# Patient Record
Sex: Male | Born: 1970 | Race: Black or African American | Hispanic: No | Marital: Single | Smoking: Never smoker
Health system: Southern US, Community
[De-identification: ages and names within clinical notes are randomized; demographics above are authoritative.]

---

## 2003-07-07 ENCOUNTER — Emergency Department (HOSPITAL_COMMUNITY): Admission: EM | Admit: 2003-07-07 | Discharge: 2003-07-07 | Payer: Self-pay | Admitting: Emergency Medicine

## 2004-01-16 ENCOUNTER — Emergency Department: Payer: Self-pay | Admitting: Emergency Medicine

## 2005-04-11 ENCOUNTER — Emergency Department: Payer: Self-pay | Admitting: Emergency Medicine

## 2006-10-06 ENCOUNTER — Emergency Department: Payer: Self-pay | Admitting: Emergency Medicine

## 2016-10-05 ENCOUNTER — Observation Stay (HOSPITAL_BASED_OUTPATIENT_CLINIC_OR_DEPARTMENT_OTHER)
Admit: 2016-10-05 | Discharge: 2016-10-05 | Disposition: A | Discharge status: Home / Self Care | Payer: Self-pay | Attending: Internal Medicine | Admitting: Internal Medicine

## 2016-10-05 ENCOUNTER — Observation Stay
Admission: EM | Admit: 2016-10-05 | Discharge: 2016-10-05 | Disposition: A | Discharge status: Home / Self Care | Payer: Self-pay | Attending: Internal Medicine | Admitting: Internal Medicine

## 2016-10-05 ENCOUNTER — Emergency Department: Payer: Self-pay

## 2016-10-05 ENCOUNTER — Encounter: Payer: Self-pay | Admitting: Emergency Medicine

## 2016-10-05 DIAGNOSIS — R0789 Other chest pain: Principal | ICD-10-CM | POA: Insufficient documentation

## 2016-10-05 DIAGNOSIS — R079 Chest pain, unspecified: Secondary | ICD-10-CM

## 2016-10-05 DIAGNOSIS — Z6832 Body mass index (BMI) 32.0-32.9, adult: Secondary | ICD-10-CM | POA: Insufficient documentation

## 2016-10-05 DIAGNOSIS — E669 Obesity, unspecified: Secondary | ICD-10-CM | POA: Insufficient documentation

## 2016-10-05 DIAGNOSIS — R002 Palpitations: Secondary | ICD-10-CM

## 2016-10-05 LAB — CBC WITH DIFFERENTIAL/PLATELET
BASOS ABS: 0 10*3/uL (ref 0–0.1)
Basophils Relative: 1 %
Eosinophils Absolute: 0.1 10*3/uL (ref 0–0.7)
Eosinophils Relative: 1 %
HEMATOCRIT: 41.6 % (ref 40.0–52.0)
Hemoglobin: 14.2 g/dL (ref 13.0–18.0)
LYMPHS PCT: 41 %
Lymphs Abs: 2.3 10*3/uL (ref 1.0–3.6)
MCH: 28.7 pg (ref 26.0–34.0)
MCHC: 34.2 g/dL (ref 32.0–36.0)
MCV: 84 fL (ref 80.0–100.0)
Monocytes Absolute: 0.6 10*3/uL (ref 0.2–1.0)
Monocytes Relative: 10 %
NEUTROS ABS: 2.7 10*3/uL (ref 1.4–6.5)
Neutrophils Relative %: 47 %
PLATELETS: 173 10*3/uL (ref 150–440)
RBC: 4.95 MIL/uL (ref 4.40–5.90)
RDW: 13.3 % (ref 11.5–14.5)
WBC: 5.8 10*3/uL (ref 3.8–10.6)

## 2016-10-05 LAB — COMPREHENSIVE METABOLIC PANEL
ALT: 18 U/L (ref 17–63)
AST: 19 U/L (ref 15–41)
Albumin: 3.5 g/dL (ref 3.5–5.0)
Alkaline Phosphatase: 42 U/L (ref 38–126)
Anion gap: 6 (ref 5–15)
BILIRUBIN TOTAL: 1.3 mg/dL — AB (ref 0.3–1.2)
BUN: 14 mg/dL (ref 6–20)
CHLORIDE: 104 mmol/L (ref 101–111)
CO2: 27 mmol/L (ref 22–32)
CREATININE: 1.2 mg/dL (ref 0.61–1.24)
Calcium: 8.6 mg/dL — ABNORMAL LOW (ref 8.9–10.3)
GFR calc Af Amer: >60 mL/min (ref >60)
GLUCOSE: 106 mg/dL — AB (ref 65–99)
Potassium: 4.2 mmol/L (ref 3.5–5.1)
Sodium: 137 mmol/L (ref 135–145)
Total Protein: 6.3 g/dL — ABNORMAL LOW (ref 6.5–8.1)

## 2016-10-05 LAB — TSH: TSH: 4.198 u[IU]/mL (ref 0.350–4.500)

## 2016-10-05 LAB — TROPONIN I
Troponin I: <0.03 ng/mL
Troponin I: <0.03 ng/mL (ref <0.03)

## 2016-10-05 LAB — ECHOCARDIOGRAM COMPLETE
Height: 72 in
Weight: 3859.2 [oz_av]

## 2016-10-05 MED ORDER — SODIUM CHLORIDE 0.9 % IV BOLUS (SEPSIS)
1000.0000 mL | Freq: Once | INTRAVENOUS | Status: AC
  Administered 2016-10-05: 1000 mL via INTRAVENOUS

## 2016-10-05 MED ORDER — ONDANSETRON HCL 4 MG/2ML IJ SOLN: 4.0000 mg | Freq: Four times a day (QID) | INTRAMUSCULAR | Status: DC | PRN

## 2016-10-05 MED ORDER — ACETAMINOPHEN 325 MG PO TABS: 650.0000 mg | ORAL_TABLET | Freq: Four times a day (QID) | ORAL | Status: DC | PRN

## 2016-10-05 MED ORDER — ENOXAPARIN SODIUM 40 MG/0.4ML ~~LOC~~ SOLN: 40.0000 mg | SUBCUTANEOUS | Status: DC

## 2016-10-05 MED ORDER — MORPHINE SULFATE (PF) 4 MG/ML IV SOLN
4.0000 mg | Freq: Once | INTRAVENOUS | Status: AC
  Administered 2016-10-05: 4 mg via INTRAVENOUS
  Filled 2016-10-05: qty 1

## 2016-10-05 MED ORDER — DOCUSATE SODIUM 100 MG PO CAPS: 100.0000 mg | ORAL_CAPSULE | Freq: Two times a day (BID) | ORAL | Status: DC

## 2016-10-05 MED ORDER — IOPAMIDOL (ISOVUE-370) INJECTION 76%
75.0000 mL | Freq: Once | INTRAVENOUS | Status: AC | PRN
  Administered 2016-10-05: 75 mL via INTRAVENOUS

## 2016-10-05 MED ORDER — ACETAMINOPHEN 650 MG RE SUPP: 650.0000 mg | Freq: Four times a day (QID) | RECTAL | Status: DC | PRN

## 2016-10-05 MED ORDER — ONDANSETRON HCL 4 MG/2ML IJ SOLN
4.0000 mg | Freq: Once | INTRAMUSCULAR | Status: AC
  Administered 2016-10-05: 4 mg via INTRAVENOUS
  Filled 2016-10-05: qty 2

## 2016-10-05 MED ORDER — ONDANSETRON HCL 4 MG PO TABS: 4.0000 mg | ORAL_TABLET | Freq: Four times a day (QID) | ORAL | Status: DC | PRN

## 2016-10-05 NOTE — Progress Notes (Signed)
Jesse Adams was seen and treated at [facility] yesterday [phone](Sat., July 14th) and today Jesse Adams(Sun., July 15th), and was discharged from said facility at about 6:15P on Sunday evening. He is authorized to return to work tomorrow - Monday, July 16th with no restrictions per Dr. 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 Jesse Adams

## 2016-10-05 NOTE — ED Triage Notes (Signed)
Pt is ambulatory to triage with c/o chest pain that started in center chest and radiated to the right arm. Pt denies any other cardiac symptoms. Pt is in NAD at this time.

## 2016-10-05 NOTE — ED Notes (Signed)
Pt transport to 235

## 2016-10-05 NOTE — Consult Note (Addendum)
Cardiology Consult    Patient ID: Jesse Adams MRN: [id number], DOB/AGE: 11/03/1970   Admit date: 10/05/2016 Date of Consult: 10/05/2016  Primary Physician: Patient, No Pcp Per Primary Cardiologist:  New to [facility] Requesting Provider: Shaune Pollack, MD   Patient Profile    Jesse Adams is a 46 y.o. male with a No past medical history noted, who is being seen today for the evaluation of right-sided chest pain and abnormal CT scan at the request of Dr. Imogene Burn.  Past Medical History   History reviewed. No pertinent past medical history. Marland Kitchen -- Notably, no history of diabetes, hypertension, hyperlipidemia. No smoking history History reviewed. No pertinent surgical history.   Allergies  No Known Allergies  History of Present Illness    Patient is a very pleasant gentleman who has no major medical history brought to the emergency room by EMS after he had sudden onset right-sided retrosternal chest pain that radiated to the back. He noticed it when he's lying down to go to sleep. Not associated with any type of exertion. He did not notice it is worse sitting up versus lying down. When the pain came on, it took his breath away, but he otherwise did not notice dyspnea. The pain sort of "lingered "and when he could not get a hold of his wife, he was concerned and called EMS. It continued when he went down to his couch to sit. His breathing did improve. Pain was reduced down from about 9/10-10 at worst one home to about 8/10 in the EMS truck and was 6/10 upon arrival to the emergency room. Upon evaluation in emergency room, EKG was relatively normal, and he has now subsequently ruled out with 3 negative troponin levels. Cardiology was consulted partly because of chest pain, but also because CT scan performed the ER suggested some type of fluid collection anterior to the heart. An echocardiogram was therefore ordered. I reviewed the echocardiogram, but it has not had a formal  read.  At baseline, he is very active, plays basketball and other exercise without any difficulty. Prior to this episode: Cardiovascular ROS: no chest pain or dyspnea on exertion negative for - edema, irregular heartbeat, loss of consciousness, murmur, orthopnea, palpitations, paroxysmal nocturnal dyspnea, rapid heart rate or shortness of breath   Inpatient Medications    . docusate sodium  100 mg Oral BID  . enoxaparin (LOVENOX) injection  40 mg Subcutaneous Q24H    Family History    Family History  Problem Relation Age of Onset  . CAD Father     Social History    Social History   Social History  . Marital status: Single    Spouse name: N/A  . Number of children: N/A  . Years of education: N/A   Occupational History  . Not on file.   Social History Main Topics  . Smoking status: Never Smoker  . Smokeless tobacco: Never Used  . Alcohol use No  . Drug use: No  . Sexual activity: Not on file   Other Topics Concern  . Not on file   Social History Narrative  . No narrative on file     Review of Systems    General:  No chills, fever, night sweats or weight changes.  Cardiovascular:  Prior to presentation: No chest pain. Otherwise No dyspnea on exertion, edema, orthopnea, palpitations, paroxysmal nocturnal dyspnea. Dermatological: No rash, lesions/masses Respiratory: No cough, dyspnea Urologic: No hematuria, dysuria Abdominal:   No nausea, vomiting, diarrhea, bright  red blood per rectum, melena, or hematemesis Neurologic:  No visual changes, wkns, changes in mental status. All other systems reviewed and are otherwise negative except as noted above.  Physical Exam    Blood pressure 108/64, pulse 68, temperature 98.1 F (36.7 C), temperature source Oral, resp. rate 18, height 6' (1.829 m), weight 241 lb 3.2 oz (109.4 kg), SpO2 95 %.  General: Pleasant, NAD.  Well-nourished, well-groomed. Healthy-appearing. Resting comfortably. Psych: .Pleasant mood and  affect. Neuro: Alert and oriented X 3. Moves all extremities spontaneously. CN II-XII grossly intact HEENT: Normal  Neck: Supple without bruits or JVD. Lungs:  Resp regular and unlabored, CTA. Heart: RRR no s3, s4, or murmurs. Normal S1 & S2.  Non-displaced PMI Abdomen: Soft, non-tender, non-distended, BS + x 4.  Extremities: No clubbing, cyanosis or edema. DP/PT/Radials 2+ and equal bilaterally.  Labs    Troponin (Point of Care Test) No results for input(s): TROPIPOC in the last 72 hours.  Recent Labs  10/05/16 0144 10/05/16 0711 10/05/16 1259  TROPONINI <0.03 <0.03 <0.03   Lab Results  Component Value Date   WBC 5.8 10/05/2016   HGB 14.2 10/05/2016   HCT 41.6 10/05/2016   MCV 84.0 10/05/2016   PLT 173 10/05/2016    Recent Labs Lab 10/05/16 0144  NA 137  K 4.2  CL 104  CO2 27  BUN 14  CREATININE 1.20  CALCIUM 8.6*  PROT 6.3*  BILITOT 1.3*  ALKPHOS 42  ALT 18  AST 19  GLUCOSE 106*   No results found for: CHOL, HDL, LDLCALC, TRIG No results found for: [facility]   Radiology Studies    I personally reviewed CT scan in order to search for the ovoid fluid attenuation. This appears to be relatively high/cranial from a echocardiogram standpoint. Not likely to be LV seen by echo. Ct Angio Chest Pe W/cm &/or Wo Cm  Result Date: 10/05/2016 CLINICAL DATA:  46 year old male with right-sided chest pain. EXAM: CT ANGIOGRAPHY CHEST WITH CONTRAST TECHNIQUE: Multidetector CT imaging of the chest was performed using the standard protocol during bolus administration of intravenous contrast. Multiplanar CT image reconstructions and MIPs were obtained to evaluate the vascular anatomy. CONTRAST:  75 cc Isovue 370 COMPARISON:  Chest radiograph dated 10/05/2016 FINDINGS: Cardiovascular: Top-normal cardiac size. No pericardial effusion. The thoracic aorta is unremarkable. The origins of the great vessels of the aortic arch appear patent. There is no CT evidence of pulmonary embolism.  Mediastinum/Nodes: There is a top-normal right paratracheal lymph node. A 12 x 33 mm ovoid structure with fluid attenuation in the anterior mediastinum anterior to the aortic arch may represent small amount of fluid within the pericardial recesses or a mildly enlarged lymph node. There is no hilar adenopathy. The esophagus and the thyroid gland are grossly unremarkable. Lungs/Pleura: The lungs are clear. There is no pleural effusion or pneumothorax. The central airways are patent. Upper Abdomen: A 1.7 x 1.9 cm low attenuating lesion in the right lobe of the liver (series 4, image 78) is not well characterized. MRI may provide better characterisation. The visualized upper abdomen is otherwise unremarkable. Musculoskeletal: No chest wall abnormality. No acute or significant osseous findings. Review of the MIP images confirms the above findings. IMPRESSION: 1. No acute intrathoracic pathology. No CT evidence of pulmonary embolism. 2. Top-normal cardiac size. 3. Ovoid fluid attenuating density in the anterior mediastinum may represent a small amount of fluid in the pericardial recess or a mildly enlarged lymph nodes of indeterminate etiology or clinical significance. Clinical  correlation is recommended. 4. A 1.7 x 1.9 cm indeterminate low attenuating lesion in the right lobe of the liver. MRI may provide better characterisation. Electronically Signed   By: Elgie Collard M.D.   On: 10/05/2016 04:10   Dg Chest Port 1 View  Result Date: 10/05/2016 CLINICAL DATA:  46 year old male with chest pain. EXAM: PORTABLE CHEST 1 VIEW COMPARISON:  Chest radiograph dated 07/07/2003 FINDINGS: The lungs are clear. There is no pleural effusion or pneumothorax. Stable top-normal cardiac silhouette. No acute osseous pathology. IMPRESSION: No active disease. Electronically Signed   By: Elgie Collard M.D.   On: 10/05/2016 02:06    ECG & Cardiac Imaging    All EKGs and echocardiogram as well as CT scan personally  reviewed.  Initial EKG: Sinus rhythm, rate 64 bpm. (It is not atrial flutter). J-point elevation/early April changes noted. No ST Elevation MI  EKG from 152 AM: Normal sinus rhythm, normal rate of 62 BPM. Subtle J-point elevation in I and II. Likely J-point elevation/nonspecific. Otherwise normal EKG.  2-D Echocardiogram (personally reviewed): Normal LV size and function. EF 60-65%. Normal regional wall motion and normal diastolic function. Normal valves. Upper limit or normal LV thickness. No evidence of pericardial effusion. Unable to see the fluid-filled structure seen on CT scan (would likely be to cranial for viewing by echo).  Assessment & Plan    Principal Problem:   Chest pain with low risk for cardiac etiology  Patient is an otherwise healthy young man with no past medical history. He is not a smoker. He presents with symptoms that are atypical in nature from a cardiac standpoint and not likely anginal based on presence at rest and not with exertion. Prolonged episode of chest pain with shortness of breath but not associated with positive troponin is clearly not ACS nature.  Echocardiogram is essentially normal. Unable to see anything to explain the findings noted on CT scan. This can be followed up in the outpatient setting by his PCP.  From a cardiac standpoint he can be discharged today relatively safely with plans to set up an outpatient Graded Exercise Tolerance Test (GXT/ETT) for risk stratification. I will have the office contact him at home to schedule this appointment. He will then potentially follow up with Dr. Cristal Deer End in the [place] office to discuss the results.    Signed, Bryan Lemma, MD, MS 10/05/2016, 4:54 PM

## 2016-10-05 NOTE — ED Provider Notes (Signed)
[facility] Emergency Department Provider Note   ____________________________________________   First MD Initiated Contact with Patient 10/05/16 0147     (approximate)  I have reviewed the triage vital signs and the nursing notes.   HISTORY  Chief Complaint Chest Pain    HPI Jesse Adams is a 46 y.o. male brought to the ED from home via EMS with a chief complaint of chest pain. Patient was getting ready for bed when he experienced right-sided chest tightness radiating into his right arm and into his back. Denies associated diaphoresis, shortness of breath, nausea/vomiting, palpitations or dizziness. Chest discomfort worse on inspiration. Denies recent fever, chills, cough, congestion, abdominal pain, diarrhea. Denies recent travel, trauma or hormone use. Patient received aspirin per EMS prior to arrival.   Past medical history None  Patient Active Problem List   Diagnosis Date Noted  . Chest pain 10/05/2016    History reviewed. No pertinent surgical history.  Prior to Admission medications   Not on File    Allergies Patient has no known allergies.  Family history Father with CAD at age 90  Social History Social History  Substance Use Topics  . Smoking status: Never Smoker  . Smokeless tobacco: Never Used  . Alcohol use No    Review of Systems  Constitutional: No fever/chills. Eyes: No visual changes. ENT: No sore throat. Cardiovascular: Positive for chest pain. Respiratory: Denies shortness of breath. Gastrointestinal: No abdominal pain.  No nausea, no vomiting.  No diarrhea.  No constipation. Genitourinary: Negative for dysuria. Musculoskeletal: Negative for back pain. Skin: Negative for rash. Neurological: Negative for headaches, focal weakness or numbness.   ____________________________________________   PHYSICAL EXAM:  VITAL SIGNS: ED Triage Vitals  Enc Vitals Group     BP --      Pulse Rate 10/05/16  0143 66     Resp 10/05/16 0143 16     Temp 10/05/16 0143 98.3 F (36.8 C)     Temp Source 10/05/16 0143 Oral     SpO2 10/05/16 0143 97 %     Weight 10/05/16 0148 241 lb (109.3 kg)     Height 10/05/16 0148 6' (1.829 m)     Head Circumference --      Peak Flow --      Pain Score 10/05/16 0142 6     Pain Loc --      Pain Edu? --      Excl. in GC? --     Constitutional: Alert and oriented. Well appearing and in no acute distress. Eyes: Conjunctivae are normal. PERRL. EOMI. Head: Atraumatic. Nose: No congestion/rhinnorhea. Mouth/Throat: Mucous membranes are moist.  Oropharynx non-erythematous. Neck: No stridor.   Cardiovascular: Normal rate, regular rhythm. Grossly normal heart sounds.  Good peripheral circulation. Respiratory: Normal respiratory effort.  No retractions. Lungs CTAB. Gastrointestinal: Soft and nontender. No distention. No abdominal bruits. No CVA tenderness. Musculoskeletal: No lower extremity tenderness nor edema.  No joint effusions. Neurologic:  Normal speech and language. No gross focal neurologic deficits are appreciated.  Skin:  Skin is warm, dry and intact. No rash noted. Psychiatric: Mood and affect are normal. Speech and behavior are normal.  ____________________________________________   LABS (all labs ordered are listed, but only abnormal results are displayed)  Labs Reviewed  COMPREHENSIVE METABOLIC PANEL - Abnormal; Notable for the following:       Result Value   Glucose, Bld 106 (*)    Calcium 8.6 (*)    Total Protein 6.3 (*)  Total Bilirubin 1.3 (*)    All other components within normal limits  CBC WITH DIFFERENTIAL/PLATELET  TROPONIN I   ____________________________________________  EKG  ED ECG REPORT I, Sharmane Dame J, the attending physician, personally viewed and interpreted this ECG.   Date: 10/05/2016  EKG Time: 0152  Rate: 62  Rhythm: normal EKG, normal sinus rhythm  Axis: Normal  Intervals:none  ST&T Change:  Nonspecific  ____________________________________________  RADIOLOGY  Ct Angio Chest Pe W/cm &/or Wo Cm  Result Date: 10/05/2016 CLINICAL DATA:  46 year old male with right-sided chest pain. EXAM: CT ANGIOGRAPHY CHEST WITH CONTRAST TECHNIQUE: Multidetector CT imaging of the chest was performed using the standard protocol during bolus administration of intravenous contrast. Multiplanar CT image reconstructions and MIPs were obtained to evaluate the vascular anatomy. CONTRAST:  75 cc Isovue 370 COMPARISON:  Chest radiograph dated 10/05/2016 FINDINGS: Cardiovascular: Top-normal cardiac size. No pericardial effusion. The thoracic aorta is unremarkable. The origins of the great vessels of the aortic arch appear patent. There is no CT evidence of pulmonary embolism. Mediastinum/Nodes: There is a top-normal right paratracheal lymph node. A 12 x 33 mm ovoid structure with fluid attenuation in the anterior mediastinum anterior to the aortic arch may represent small amount of fluid within the pericardial recesses or a mildly enlarged lymph node. There is no hilar adenopathy. The esophagus and the thyroid gland are grossly unremarkable. Lungs/Pleura: The lungs are clear. There is no pleural effusion or pneumothorax. The central airways are patent. Upper Abdomen: A 1.7 x 1.9 cm low attenuating lesion in the right lobe of the liver (series 4, image 78) is not well characterized. MRI may provide better characterisation. The visualized upper abdomen is otherwise unremarkable. Musculoskeletal: No chest wall abnormality. No acute or significant osseous findings. Review of the MIP images confirms the above findings. IMPRESSION: 1. No acute intrathoracic pathology. No CT evidence of pulmonary embolism. 2. Top-normal cardiac size. 3. Ovoid fluid attenuating density in the anterior mediastinum may represent a small amount of fluid in the pericardial recess or a mildly enlarged lymph nodes of indeterminate etiology or clinical  significance. Clinical correlation is recommended. 4. A 1.7 x 1.9 cm indeterminate low attenuating lesion in the right lobe of the liver. MRI may provide better characterisation. Electronically Signed   By: Elgie Collard M.D.   On: 10/05/2016 04:10   Dg Chest Port 1 View  Result Date: 10/05/2016 CLINICAL DATA:  45 year old male with chest pain. EXAM: PORTABLE CHEST 1 VIEW COMPARISON:  Chest radiograph dated 07/07/2003 FINDINGS: The lungs are clear. There is no pleural effusion or pneumothorax. Stable top-normal cardiac silhouette. No acute osseous pathology. IMPRESSION: No active disease. Electronically Signed   By: Elgie Collard M.D.   On: 10/05/2016 02:06    ____________________________________________   PROCEDURES  Procedure(s) performed: None  Procedures  Critical Care performed: No  ____________________________________________   INITIAL IMPRESSION / ASSESSMENT AND PLAN / ED COURSE  Pertinent labs & imaging results that were available during my care of the patient were reviewed by me and considered in my medical decision making (see chart for details).  46 year old male with no past medical history who presents with right sided chest tightness worsened on inspiration. EKG and first troponin are unremarkable. Noted slightly elevated bilirubin the patient does not have right upper quadrant tenderness on exam. Will obtain CT chest to evaluate for PE given patient's pain which is worsened with inspiration. Will repeat timed troponin.   FINAL CLINICAL IMPRESSION(S) / ED DIAGNOSES  Final diagnoses:  Nonspecific  chest pain      NEW MEDICATIONS STARTED DURING THIS VISIT:  New Prescriptions   No medications on file     Note:  This document was prepared using Dragon voice recognition software and may include unintentional dictation errors.    Irean HongSung, Jamye Balicki J, MD 10/05/16 [phone]

## 2016-10-05 NOTE — Care Management Obs Status (Signed)
MEDICARE OBSERVATION STATUS NOTIFICATION   Patient Details  Name: Jesse Adams MRN: [id number] Date of Birth: Oct 09, 1970   Medicare Observation Status Notification Given:  No (moon not given per D/C within 24 hours)    Kingson Lohmeyer A, RN 10/05/2016, 12:09 PM

## 2016-10-05 NOTE — Discharge Instructions (Signed)
Follow up PCP for MRI of abdomen.

## 2016-10-05 NOTE — Progress Notes (Signed)
*  PRELIMINARY RESULTS* Echocardiogram 2D Echocardiogram has been performed.  Garrel Ridgelikeshia S Narada Uzzle 10/05/2016, 3:56 PM

## 2016-10-05 NOTE — Discharge Summary (Signed)
[facility] [place] [facility]   PATIENT NAME: Jesse Adams    MR#:  [id number]  DATE OF BIRTH:  05-15-1970  DATE OF ADMISSION:  10/05/2016   ADMITTING PHYSICIAN: Arnaldo NatalMichael S Diamond, MD  DATE OF DISCHARGE: 10/05/2016 PRIMARY CARE PHYSICIAN: Patient, No Pcp Per   ADMISSION DIAGNOSIS:  Nonspecific chest pain [R07.9] DISCHARGE DIAGNOSIS:  Active Problems:   Chest pain  SECONDARY DIAGNOSIS:  History reviewed. No pertinent past medical history. HOSPITAL COURSE:  1. Chest pain: Atypical for cardiac pain.  Results. No ACS. Follow-up echocardiogram. 2. Obesity: BMI is 32.9; encouraged healthy diet and exercise 3. A 1.7 x 1.9 cm indeterminate low attenuating lesion in the right lobe of the liver. [facility]. 4. Mild elevated bilirubin. Follow-up PCP as outpatient. I discussed with Dr. Herbie BaltimoreHarding. DISCHARGE CONDITIONS:  Stable, discharge to home today. CONSULTS OBTAINED:  Treatment Team:  Marykay LexHarding, David W, MD DRUG ALLERGIES:  No Known Allergies DISCHARGE MEDICATIONS:   Allergies as of 10/05/2016   No Known Allergies     Medication List    You have not been prescribed any medications.      DISCHARGE INSTRUCTIONS:  See AVS.  If you experience worsening of your admission symptoms, develop shortness of breath, life threatening emergency, suicidal or homicidal thoughts you must seek medical attention immediately by calling 911 or calling your MD immediately  if symptoms less severe.  You Must read complete instructions/literature along with all the possible adverse reactions/side effects for all the Medicines you take and that have been prescribed to you. Take any new Medicines after you have completely understood and accpet all the possible adverse reactions/side effects.   Please note  You were cared for by a hospitalist during your hospital stay. If you have any questions about your discharge medications or the care you  received while you were in the hospital after you are discharged, you can call the unit and asked to speak with the hospitalist on call if the hospitalist that took care of you is not available. Once you are discharged, your primary care physician will handle any further medical issues. Please note that NO REFILLS for any discharge medications will be authorized once you are discharged, as it is imperative that you return to your primary care physician (or establish a relationship with a primary care physician if you do not have one) for your aftercare needs so that they can reassess your need for medications and monitor your lab values.    On the day of Discharge:  VITAL SIGNS:  Blood pressure 108/64, pulse 68, temperature 98.1 F (36.7 C), temperature source Oral, resp. rate 18, height 6' (1.829 m), weight 241 lb 3.2 oz (109.4 kg), SpO2 95 %. PHYSICAL EXAMINATION:  GENERAL:  46 y.o.-year-old patient lying in the bed with no acute distress. Obese. EYES: Pupils equal, round, reactive to light and accommodation. No scleral icterus. Extraocular muscles intact.  HEENT: Head atraumatic, normocephalic. Oropharynx and nasopharynx clear.  NECK:  Supple, no jugular venous distention. No thyroid enlargement, no tenderness.  LUNGS: Normal breath sounds bilaterally, no wheezing, rales,rhonchi or crepitation. No use of accessory muscles of respiration.  CARDIOVASCULAR: S1, S2 normal. No murmurs, rubs, or gallops.  ABDOMEN: Soft, non-tender, non-distended. Bowel sounds present. No organomegaly or mass.  EXTREMITIES: No pedal edema, cyanosis, or clubbing.  NEUROLOGIC: Cranial nerves II through XII are intact. Muscle strength 5/5 in all extremities. Sensation intact. Gait not checked.  PSYCHIATRIC: The patient  is alert and oriented x 3.  SKIN: No obvious rash, lesion, or ulcer.  DATA REVIEW:   CBC  Recent Labs Lab 10/05/16 0144  WBC 5.8  HGB 14.2  HCT 41.6  PLT 173    Chemistries   Recent  Labs Lab 10/05/16 0144  NA 137  K 4.2  CL 104  CO2 27  GLUCOSE 106*  BUN 14  CREATININE 1.20  CALCIUM 8.6*  AST 19  ALT 18  ALKPHOS 42  BILITOT 1.3*     Microbiology Results  No results found for this or any previous visit.  RADIOLOGY:  Ct Angio Chest Pe W/cm &/or Wo Cm  Result Date: 10/05/2016 CLINICAL DATA:  46 year old male with right-sided chest pain. EXAM: CT ANGIOGRAPHY CHEST WITH CONTRAST TECHNIQUE: Multidetector CT imaging of the chest was performed using the standard protocol during bolus administration of intravenous contrast. Multiplanar CT image reconstructions and MIPs were obtained to evaluate the vascular anatomy. CONTRAST:  75 cc Isovue 370 COMPARISON:  Chest radiograph dated 10/05/2016 FINDINGS: Cardiovascular: Top-normal cardiac size. No pericardial effusion. The thoracic aorta is unremarkable. The origins of the great vessels of the aortic arch appear patent. There is no CT evidence of pulmonary embolism. Mediastinum/Nodes: There is a top-normal right paratracheal lymph node. A 12 x 33 mm ovoid structure with fluid attenuation in the anterior mediastinum anterior to the aortic arch may represent small amount of fluid within the pericardial recesses or a mildly enlarged lymph node. There is no hilar adenopathy. The esophagus and the thyroid gland are grossly unremarkable. Lungs/Pleura: The lungs are clear. There is no pleural effusion or pneumothorax. The central airways are patent. Upper Abdomen: A 1.7 x 1.9 cm low attenuating lesion in the right lobe of the liver (series 4, image 78) is not well characterized. MRI may provide better characterisation. The visualized upper abdomen is otherwise unremarkable. Musculoskeletal: No chest wall abnormality. No acute or significant osseous findings. Review of the MIP images confirms the above findings. IMPRESSION: 1. No acute intrathoracic pathology. No CT evidence of pulmonary embolism. 2. Top-normal cardiac size. 3. Ovoid fluid  attenuating density in the anterior mediastinum may represent a small amount of fluid in the pericardial recess or a mildly enlarged lymph nodes of indeterminate etiology or clinical significance. Clinical correlation is recommended. 4. A 1.7 x 1.9 cm indeterminate low attenuating lesion in the right lobe of the liver. MRI may provide better characterisation. Electronically Signed   By: Elgie Collard M.D.   On: 10/05/2016 04:10   Dg Chest Port 1 View  Result Date: 10/05/2016 CLINICAL DATA:  46 year old male with chest pain. EXAM: PORTABLE CHEST 1 VIEW COMPARISON:  Chest radiograph dated 07/07/2003 FINDINGS: The lungs are clear. There is no pleural effusion or pneumothorax. Stable top-normal cardiac silhouette. No acute osseous pathology. IMPRESSION: No active disease. Electronically Signed   By: Elgie Collard M.D.   On: 10/05/2016 02:06     Management plans discussed with the patient, his wife and they are in agreement.  CODE STATUS: Full Code   TOTAL TIME TAKING CARE OF THIS PATIENT: 25 minutes.    Shaune Pollack M.D on 10/05/2016 at 2:34 PM  Between 7am to 6pm - Pager - [phone]  After 6pm go to [email or web address] - Social research officer, government  [facility]  Office  [phone]  CC: Primary care physician; Patient, No Pcp Per   Note: This dictation was prepared with Dragon dictation along with smaller phrase technology. Any transcriptional errors that result from  this process are unintentional.

## 2016-10-05 NOTE — H&P (Signed)
Jesse Adams is an 46 y.o. male.   Chief Complaint: Chest pain HPI: The patient with no past medical history presents to the emergency department complaining of chest pain. He states that he was getting ready for bed and lay down when he felt an acute sharp pain in his right chest that radiated straight to his back. He admits to shortness of breath at the onset of chest pain. The pain lingered and was pressure-like in character. Location became more substernal. He reports that sometimes he feels like the pain is worse when shifting in bed. In the emergency department his pain decreased slightly but persisted which prompted the emergency department staff to call hospitalist service for admission.  History reviewed. No pertinent past medical history. None  History reviewed. No pertinent surgical history. none  Family History  Problem Relation Age of Onset  . CAD Father    Social History:  reports that he has never smoked. He has never used smokeless tobacco. He reports that he does not drink alcohol or use drugs.  Allergies: No Known Allergies  Prior to Admission medications   Not on File  None   Results for orders placed or performed during the hospital encounter of 10/05/16 (from the past 48 hour(s))  CBC with Differential     Status: None   Collection Time: 10/05/16  1:44 AM  Result Value Ref Range   WBC 5.8 3.8 - 10.6 K/uL   RBC 4.95 4.40 - 5.90 MIL/uL   Hemoglobin 14.2 13.0 - 18.0 g/dL   HCT 41.6 40.0 - 52.0 %   MCV 84.0 80.0 - 100.0 fL   MCH 28.7 26.0 - 34.0 pg   MCHC 34.2 32.0 - 36.0 g/dL   RDW 13.3 11.5 - 14.5 %   Platelets 173 150 - 440 K/uL   Neutrophils Relative % 47 %   Neutro Abs 2.7 1.4 - 6.5 K/uL   Lymphocytes Relative 41 %   Lymphs Abs 2.3 1.0 - 3.6 K/uL   Monocytes Relative 10 %   Monocytes Absolute 0.6 0.2 - 1.0 K/uL   Eosinophils Relative 1 %   Eosinophils Absolute 0.1 0 - 0.7 K/uL   Basophils Relative 1 %   Basophils Absolute 0.0 0 - 0.1 K/uL   Comprehensive metabolic panel     Status: Abnormal   Collection Time: 10/05/16  1:44 AM  Result Value Ref Range   Sodium 137 135 - 145 mmol/L   Potassium 4.2 3.5 - 5.1 mmol/L   Chloride 104 101 - 111 mmol/L   CO2 27 22 - 32 mmol/L   Glucose, Bld 106 (H) 65 - 99 mg/dL   BUN 14 6 - 20 mg/dL   Creatinine, Ser 1.20 0.61 - 1.24 mg/dL   Calcium 8.6 (L) 8.9 - 10.3 mg/dL   Total Protein 6.3 (L) 6.5 - 8.1 g/dL   Albumin 3.5 3.5 - 5.0 g/dL   AST 19 15 - 41 U/L   ALT 18 17 - 63 U/L   Alkaline Phosphatase 42 38 - 126 U/L   Total Bilirubin 1.3 (H) 0.3 - 1.2 mg/dL   GFR calc non Af Amer >60 >60 mL/min   GFR calc Af Amer >60 >60 mL/min    Comment: (NOTE) The eGFR has been calculated using the CKD EPI equation. This calculation has not been validated in all clinical situations. eGFR's persistently <60 mL/min signify possible Chronic Kidney Disease.    Anion gap 6 5 - 15  Troponin I  Status: None   Collection Time: 10/05/16  1:44 AM  Result Value Ref Range   Troponin I <0.03 <0.03 ng/mL   Ct Angio Chest Pe W/cm &/or Wo Cm  Result Date: 10/05/2016 CLINICAL DATA:  46 year old male with right-sided chest pain. EXAM: CT ANGIOGRAPHY CHEST WITH CONTRAST TECHNIQUE: Multidetector CT imaging of the chest was performed using the standard protocol during bolus administration of intravenous contrast. Multiplanar CT image reconstructions and MIPs were obtained to evaluate the vascular anatomy. CONTRAST:  75 cc Isovue 370 COMPARISON:  Chest radiograph dated 10/05/2016 FINDINGS: Cardiovascular: Top-normal cardiac size. No pericardial effusion. The thoracic aorta is unremarkable. The origins of the great vessels of the aortic arch appear patent. There is no CT evidence of pulmonary embolism. Mediastinum/Nodes: There is a top-normal right paratracheal lymph node. A 12 x 33 mm ovoid structure with fluid attenuation in the anterior mediastinum anterior to the aortic arch may represent small amount of fluid within  the pericardial recesses or a mildly enlarged lymph node. There is no hilar adenopathy. The esophagus and the thyroid gland are grossly unremarkable. Lungs/Pleura: The lungs are clear. There is no pleural effusion or pneumothorax. The central airways are patent. Upper Abdomen: A 1.7 x 1.9 cm low attenuating lesion in the right lobe of the liver (series 4, image 78) is not well characterized. MRI may provide better characterisation. The visualized upper abdomen is otherwise unremarkable. Musculoskeletal: No chest wall abnormality. No acute or significant osseous findings. Review of the MIP images confirms the above findings. IMPRESSION: 1. No acute intrathoracic pathology. No CT evidence of pulmonary embolism. 2. Top-normal cardiac size. 3. Ovoid fluid attenuating density in the anterior mediastinum may represent a small amount of fluid in the pericardial recess or a mildly enlarged lymph nodes of indeterminate etiology or clinical significance. Clinical correlation is recommended. 4. A 1.7 x 1.9 cm indeterminate low attenuating lesion in the right lobe of the liver. MRI may provide better characterisation. Electronically Signed   By: Anner Crete M.D.   On: 10/05/2016 04:10   Dg Chest Port 1 View  Result Date: 10/05/2016 CLINICAL DATA:  46 year old male with chest pain. EXAM: PORTABLE CHEST 1 VIEW COMPARISON:  Chest radiograph dated 07/07/2003 FINDINGS: The lungs are clear. There is no pleural effusion or pneumothorax. Stable top-normal cardiac silhouette. No acute osseous pathology. IMPRESSION: No active disease. Electronically Signed   By: Anner Crete M.D.   On: 10/05/2016 02:06    Review of Systems  Constitutional: Negative for chills and fever.  HENT: Negative for sore throat and tinnitus.   Eyes: Negative for blurred vision and redness.  Respiratory: Negative for cough and shortness of breath.   Cardiovascular: Positive for palpitations. Negative for chest pain, orthopnea and PND.   Gastrointestinal: Negative for abdominal pain, diarrhea, nausea and vomiting.  Genitourinary: Negative for dysuria, frequency and urgency.  Musculoskeletal: Negative for joint pain and myalgias.  Skin: Negative for rash.       No lesions  Neurological: Negative for speech change, focal weakness and weakness.  Endo/Heme/Allergies: Does not bruise/bleed easily.       No temperature intolerance  Psychiatric/Behavioral: Negative for depression and suicidal ideas.    Blood pressure 101/64, pulse 63, temperature 98.3 F (36.8 C), temperature source Oral, resp. rate 15, height 6' (1.829 m), weight 109.3 kg (241 lb), SpO2 95 %. Physical Exam  Nursing note and vitals reviewed. Constitutional: He is oriented to person, place, and time. He appears well-developed and well-nourished. No distress.  HENT:  Head: Normocephalic and atraumatic.  Mouth/Throat: Oropharynx is clear and moist.  Eyes: Pupils are equal, round, and reactive to light. Conjunctivae and EOM are normal. No scleral icterus.  Neck: Normal range of motion. Neck supple. No tracheal deviation present. No thyromegaly present.  Cardiovascular: Normal rate, regular rhythm and normal heart sounds.  Exam reveals no gallop and no friction rub.   No murmur heard. Respiratory: Effort normal and breath sounds normal. No respiratory distress.  GI: Soft. Bowel sounds are normal. He exhibits no distension. There is no tenderness.  Genitourinary:  Genitourinary Comments: Deferred  Musculoskeletal: Normal range of motion. He exhibits no edema.  Lymphadenopathy:    He has no cervical adenopathy.  Neurological: He is alert and oriented to person, place, and time. No cranial nerve deficit.  Skin: Skin is warm and dry. No rash noted. No erythema.  Psychiatric: He has a normal mood and affect. His behavior is normal. Judgment and thought content normal.     Assessment/Plan This is a 46 year old male admitted for chest pain. 1. Chest pain:  Atypical for cardiac pain. The pain is reproducible to some degree. It is unclear if it is positional. EKG does not support pericarditis however the patient prefers to sit still. Follow troponins. Monitor telemetry. Cardiology has been consulted. 2. Obesity: BMI is 32.9; encouraged healthy diet and exercise 3. DVT prophylaxis: Lovenox 4. GI prophylaxis: None The patient is a full code. Time spent on admission orders and patient care approximately 45 minutes  Harrie Foreman, MD 10/05/2016, 6:43 AM

## 2016-10-07 ENCOUNTER — Other Ambulatory Visit: Payer: Self-pay | Admitting: Physician Assistant

## 2016-10-07 ENCOUNTER — Telehealth: Payer: Self-pay | Admitting: Physician Assistant

## 2016-10-07 DIAGNOSIS — R079 Chest pain, unspecified: Secondary | ICD-10-CM

## 2016-10-07 LAB — HEMOGLOBIN A1C
Hgb A1c MFr Bld: 5.7 % — ABNORMAL HIGH (ref 4.8–5.6)
Mean Plasma Glucose: 117 mg/dL

## 2016-10-07 NOTE — Telephone Encounter (Signed)
-----   Message from Sondra Bargesyan M Dunn, PA-C sent at 10/07/2016  9:35 AM EDT ----- Yes, I will place the order. ----- Message ----- From: Charlynn GrimesStroud, Hiraa M Sent: 10/07/2016   8:55 AM To: Sondra Bargesyan M Dunn, PA-C  Should I go ahead and schedule the GXT? If so I may need order :)   ----- Message ----- From: Donald Sivaunn, Ryan M, PA-C Sent: 10/07/2016   7:03 AM To: Charlynn GrimesHiraa M Stroud    ----- Message ----- From: Marykay LexHarding, David W, MD Sent: 10/05/2016   5:21 PM To: Sondra Bargesyan M Dunn, PA-C  Ryan, this patient was seen in consult over the weekend.  Moderate to see if we can have him contacted to set up an outpatient GXT and follow-up with either you or Chris End.  Bryan Lemmaavid Harding, MD

## 2016-10-07 NOTE — Telephone Encounter (Signed)
Lmov for patient to call back and schedule GXT then follow up °Will try again at a later time °

## 2016-10-09 NOTE — Telephone Encounter (Signed)
Lmov for patient to call back and schedule GXT then follow up Will try again at a later time

## 2016-10-10 ENCOUNTER — Telehealth: Payer: Self-pay | Admitting: Internal Medicine

## 2016-10-10 NOTE — Telephone Encounter (Signed)
TCM....  Pt needs to schedule an appt with Dr Okey DupreEnd or Alycia Rossettiyan after GXT  lmov to call back and schedule this

## 2016-10-14 ENCOUNTER — Telehealth: Payer: Self-pay | Admitting: *Deleted

## 2016-10-14 NOTE — Telephone Encounter (Signed)
No answer. Left detail message with reminder for treadmill stress test tomorrow, 10/15/16 at 9:30 am and the need to schedule a f/u appt and to call back if any questions.

## 2016-10-28 ENCOUNTER — Encounter: Payer: Self-pay | Admitting: Physician Assistant

## 2017-07-16 ENCOUNTER — Emergency Department
Admission: EM | Admit: 2017-07-16 | Discharge: 2017-07-16 | Disposition: A | Discharge status: Home / Self Care | Payer: No Typology Code available for payment source | Attending: Emergency Medicine | Admitting: Emergency Medicine

## 2017-07-16 ENCOUNTER — Emergency Department: Payer: No Typology Code available for payment source

## 2017-07-16 ENCOUNTER — Other Ambulatory Visit: Payer: Self-pay

## 2017-07-16 ENCOUNTER — Encounter: Payer: Self-pay | Admitting: Emergency Medicine

## 2017-07-16 DIAGNOSIS — Y999 Unspecified external cause status: Secondary | ICD-10-CM | POA: Diagnosis not present

## 2017-07-16 DIAGNOSIS — M25511 Pain in right shoulder: Secondary | ICD-10-CM | POA: Insufficient documentation

## 2017-07-16 DIAGNOSIS — Y9389 Activity, other specified: Secondary | ICD-10-CM | POA: Insufficient documentation

## 2017-07-16 DIAGNOSIS — S4991XA Unspecified injury of right shoulder and upper arm, initial encounter: Secondary | ICD-10-CM | POA: Diagnosis present

## 2017-07-16 DIAGNOSIS — G8911 Acute pain due to trauma: Secondary | ICD-10-CM | POA: Insufficient documentation

## 2017-07-16 DIAGNOSIS — Y92410 Unspecified street and highway as the place of occurrence of the external cause: Secondary | ICD-10-CM | POA: Diagnosis not present

## 2017-07-16 MED ORDER — BACLOFEN 10 MG PO TABS: 10.0000 mg | ORAL_TABLET | Freq: Every day | ORAL | 0 refills | Status: AC

## 2017-07-16 MED ORDER — MELOXICAM 15 MG PO TABS: 15.0000 mg | ORAL_TABLET | Freq: Every day | ORAL | 2 refills | Status: AC
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
## 2017-07-16 NOTE — Discharge Instructions (Signed)
Follow-up with your regular doctor or Dr. Rosita KeaMenz if you are not better in 5 to 7 days.  Use ice on the area.  Use medications as prescribed.  If you are worsening please return to emergency department

## 2019-01-26 ENCOUNTER — Other Ambulatory Visit: Payer: Self-pay

## 2019-01-26 ENCOUNTER — Emergency Department
Admission: EM | Admit: 2019-01-26 | Discharge: 2019-01-27 | Disposition: A | Discharge status: Home / Self Care | Payer: Self-pay | Attending: Emergency Medicine | Admitting: Emergency Medicine

## 2019-01-26 DIAGNOSIS — X500XXA Overexertion from strenuous movement or load, initial encounter: Secondary | ICD-10-CM | POA: Insufficient documentation

## 2019-01-26 DIAGNOSIS — M5441 Lumbago with sciatica, right side: Secondary | ICD-10-CM | POA: Insufficient documentation

## 2019-01-26 NOTE — ED Triage Notes (Signed)
Pt in with co right lower back pain that started x 2-3 days ago. States radiates into right buttocks. States worse when he bends over or stands. Describes pain as a pulling.

## 2019-01-27 MED ORDER — MELOXICAM 15 MG PO TABS: 15.0000 mg | ORAL_TABLET | Freq: Every day | ORAL | 1 refills | Status: AC

## 2019-01-27 MED ORDER — METHOCARBAMOL 500 MG PO TABS: 500.0000 mg | ORAL_TABLET | Freq: Three times a day (TID) | ORAL | 0 refills | Status: AC | PRN

## 2019-01-27 MED ORDER — KETOROLAC TROMETHAMINE 30 MG/ML IJ SOLN
30.0000 mg | Freq: Once | INTRAMUSCULAR | Status: AC
  Administered 2019-01-27: 30 mg via INTRAMUSCULAR
  Filled 2019-01-27: qty 1

## 2019-01-27 MED ORDER — METHOCARBAMOL 500 MG PO TABS
1000.0000 mg | ORAL_TABLET | Freq: Once | ORAL | Status: AC
Start: 2019-01-27 — End: 2019-01-27
  Administered 2019-01-27: 1000 mg via ORAL
  Filled 2019-01-27: qty 2

## 2019-01-27 NOTE — ED Provider Notes (Signed)
[facility] Emergency Department Provider Note  ____________________________________________  Time seen: Approximately 12:06 AM  I have reviewed the triage vital signs and the nursing notes.   HISTORY  Chief Complaint Back Pain    HPI Jesse Adams is a 48 y.o. male presents to the emergency department with right-sided low back pain that is occurred for the past 2 to 3 days.  Patient reports that pain radiates into his right buttocks.  Patient states that he lifts 70 to 75 pounds every night for work and he feels like heavy lifting might have exacerbated his injury.  Patient does state that he has a daily stretching routine and has not experienced similar pain in the past.  He denies bowel or bladder incontinence or saddle anesthesia.  No weakness of the lower extremities.  Patient has been able to ambulate easily.  He denies fever, chills or IV drug use at home.  No other alleviating measures have been attempted.        No past medical history on file.  Patient Active Problem List   Diagnosis Date Noted  . Chest pain with low risk for cardiac etiology 10/05/2016    No past surgical history on file.  Prior to Admission medications   Not on File    Allergies Patient has no known allergies.  Family History  Problem Relation Age of Onset  . CAD Father 23       He had an MI with coronary disease. PCI    Social History Social History   Tobacco Use  . Smoking status: Never Smoker  . Smokeless tobacco: Never Used  Substance Use Topics  . Alcohol use: No  . Drug use: No     Review of Systems  Constitutional: No fever/chills Eyes: No visual changes. No discharge ENT: No upper respiratory complaints. Cardiovascular: no chest pain. Respiratory: no cough. No SOB. Gastrointestinal: No abdominal pain.  No nausea, no vomiting.  No diarrhea.  No constipation. Genitourinary: Negative for dysuria. No hematuria Musculoskeletal: Patient has  low back pain.  Skin: Negative for rash, abrasions, lacerations, ecchymosis. Neurological: Negative for headaches, focal weakness or numbness.   ____________________________________________   PHYSICAL EXAM:  VITAL SIGNS: ED Triage Vitals  Enc Vitals Group     BP 01/26/19 2225 129/70     Pulse Rate 01/26/19 2225 65     Resp 01/26/19 2225 16     Temp 01/26/19 2225 99 F (37.2 C)     Temp Source 01/26/19 2225 Oral     SpO2 01/26/19 2225 97 %     Weight 01/26/19 2225 230 lb (104.3 kg)     Height 01/26/19 2225 6' (1.829 m)     Head Circumference --      Peak Flow --      Pain Score 01/26/19 2228 10     Pain Loc --      Pain Edu? --      Excl. in [place]? --      Constitutional: Alert and oriented. Well appearing and in no acute distress. Eyes: Conjunctivae are normal. PERRL. EOMI. Head: Atraumatic. ENT: Cardiovascular: Normal rate, regular rhythm. Normal S1 and S2.  Good peripheral circulation. Respiratory: Normal respiratory effort without tachypnea or retractions. Lungs CTAB. Good air entry to the bases with no decreased or absent breath sounds. Gastrointestinal: Bowel sounds 4 quadrants. Soft and nontender to palpation. No guarding or rigidity. No palpable masses. No distention. No CVA tenderness. Musculoskeletal: Patient has some paraspinal muscle tenderness  along the lumbar spine and a positive straight leg raise test on the right. Neurologic:  Normal speech and language. No gross focal neurologic deficits are appreciated.  Skin:  Skin is warm, dry and intact. No rash noted. Psychiatric: Mood and affect are normal. Speech and behavior are normal. Patient exhibits appropriate insight and judgement.   ____________________________________________   LABS (all labs ordered are listed, but only abnormal results are displayed)  Labs Reviewed - No data to  display ____________________________________________  EKG   ____________________________________________  RADIOLOGY   No results found.  ____________________________________________    PROCEDURES  Procedure(s) performed:    Procedures    Medications  methocarbamol (ROBAXIN) tablet 1,000 mg (1,000 mg Oral Given 01/27/19 0006)  ketorolac (TORADOL) 30 MG/ML injection 30 mg (30 mg Intramuscular Given 01/27/19 0006)     ____________________________________________   INITIAL IMPRESSION / ASSESSMENT AND PLAN / ED COURSE  Pertinent labs & imaging results that were available during my care of the patient were reviewed by me and considered in my medical decision making (see chart for details).  Review of the [place] CSRS was performed in accordance of the [facility] prior to dispensing any controlled drugs.           Assessment and Plan:  Low back pain 48 year old male presents to the emergency department with right-sided low back pain that is occurred for the past 2 to 3 days.    Vital signs are reassuring at triage.  On physical exam, patient had some right-sided paraspinal muscle tenderness to palpation and a positive straight leg raise test on the right.  Patient was given Robaxin and Toradol in the emergency department and he reported that his pain improved.  He was discharged with meloxicam and Robaxin and a work note was provided.  Return precautions were given.  All patient questions were answered.   ____________________________________________  FINAL CLINICAL IMPRESSION(S) / ED DIAGNOSES  Final diagnoses:  Acute bilateral low back pain with right-sided sciatica      NEW MEDICATIONS STARTED DURING THIS VISIT:  ED Discharge Orders    None          This chart was dictated using voice recognition software/Dragon. Despite best efforts to proofread, errors can occur which can change the meaning. Any change was purely unintentional.    Orvil Feil,  PA-C 01/27/19 0011    Sharman Cheek, MD 01/29/19 1505

## 2019-03-10 ENCOUNTER — Other Ambulatory Visit: Payer: Self-pay

## 2019-03-10 ENCOUNTER — Ambulatory Visit: Payer: HRSA Program | Attending: Internal Medicine

## 2019-03-10 DIAGNOSIS — Z20828 Contact with and (suspected) exposure to other viral communicable diseases: Secondary | ICD-10-CM | POA: Insufficient documentation

## 2019-03-10 DIAGNOSIS — Z20822 Contact with and (suspected) exposure to covid-19: Secondary | ICD-10-CM

## 2019-03-11 LAB — NOVEL CORONAVIRUS, NAA: SARS-CoV-2, NAA: NOT DETECTED

## 2019-03-11 NOTE — Progress Notes (Signed)
Order(s) created erroneously. Erroneous order ID: [id number]  Order moved by: Price Lachapelle M  Order move date/time: 03/11/2019 2:42 PM  Source Patient: [id number]  Source Contact: 03/10/2019  Destination Patient: [id number]  Destination Contact: 03/10/2019 

## 2019-03-11 NOTE — Progress Notes (Signed)
Orders moved to this encounter.  

## 2019-03-11 NOTE — Progress Notes (Signed)
Order(s) created erroneously. Erroneous order ID: [id number]  Order moved by: Brigitte Pulse  Order move date/time: 03/11/2019 2:42 PM  Source Patient: [id number]  Source Contact: 03/10/2019  Destination Patient: [id number]  Destination Contact: 03/10/2019

## 2019-04-13 ENCOUNTER — Other Ambulatory Visit: Payer: Self-pay

## 2019-04-13 ENCOUNTER — Emergency Department
Admission: EM | Admit: 2019-04-13 | Discharge: 2019-04-13 | Disposition: A | Discharge status: Home / Self Care | Payer: Self-pay | Attending: Emergency Medicine | Admitting: Emergency Medicine

## 2019-04-13 DIAGNOSIS — R197 Diarrhea, unspecified: Secondary | ICD-10-CM

## 2019-04-13 DIAGNOSIS — R05 Cough: Secondary | ICD-10-CM | POA: Insufficient documentation

## 2019-04-13 DIAGNOSIS — R42 Dizziness and giddiness: Secondary | ICD-10-CM | POA: Insufficient documentation

## 2019-04-13 DIAGNOSIS — R61 Generalized hyperhidrosis: Secondary | ICD-10-CM | POA: Insufficient documentation

## 2019-04-13 DIAGNOSIS — R110 Nausea: Secondary | ICD-10-CM

## 2019-04-13 DIAGNOSIS — Z20822 Contact with and (suspected) exposure to covid-19: Secondary | ICD-10-CM

## 2019-04-13 LAB — LIPASE, BLOOD: Lipase: 33 U/L (ref 11–51)

## 2019-04-13 LAB — URINALYSIS, COMPLETE (UACMP) WITH MICROSCOPIC
Bacteria, UA: NONE SEEN
Bilirubin Urine: NEGATIVE
Glucose, UA: NEGATIVE mg/dL
Hgb urine dipstick: NEGATIVE
Ketones, ur: NEGATIVE mg/dL
Leukocytes,Ua: NEGATIVE
Nitrite: NEGATIVE
Protein, ur: 30 mg/dL — AB
Specific Gravity, Urine: 1.021 (ref 1.005–1.030)
pH: 6 (ref 5.0–8.0)

## 2019-04-13 LAB — COMPREHENSIVE METABOLIC PANEL
ALT: 19 U/L (ref 0–44)
AST: 29 U/L (ref 15–41)
Albumin: 3.8 g/dL (ref 3.5–5.0)
Alkaline Phosphatase: 43 U/L (ref 38–126)
Anion gap: 9 (ref 5–15)
BUN: 16 mg/dL (ref 6–20)
CO2: 22 mmol/L (ref 22–32)
Calcium: 8.9 mg/dL (ref 8.9–10.3)
Chloride: 105 mmol/L (ref 98–111)
Creatinine, Ser: 1.12 mg/dL (ref 0.61–1.24)
GFR calc Af Amer: >60 mL/min (ref >60)
GFR calc non Af Amer: >60 mL/min (ref >60)
Glucose, Bld: 128 mg/dL — ABNORMAL HIGH (ref 70–99)
Potassium: 3.6 mmol/L (ref 3.5–5.1)
Sodium: 136 mmol/L (ref 135–145)
Total Bilirubin: 1.1 mg/dL (ref 0.3–1.2)
Total Protein: 6.7 g/dL (ref 6.5–8.1)

## 2019-04-13 LAB — CBC
HCT: 39.9 % (ref 39.0–52.0)
Hemoglobin: 13.3 g/dL (ref 13.0–17.0)
MCH: 28.5 pg (ref 26.0–34.0)
MCHC: 33.3 g/dL (ref 30.0–36.0)
MCV: 85.4 fL (ref 80.0–100.0)
Platelets: 216 10*3/uL (ref 150–400)
RBC: 4.67 MIL/uL (ref 4.22–5.81)
RDW: 12.8 % (ref 11.5–15.5)
WBC: 3.5 10*3/uL — ABNORMAL LOW (ref 4.0–10.5)
nRBC: 0 % (ref 0.0–0.2)

## 2019-04-13 MED ORDER — ONDANSETRON 4 MG PO TBDP: 4.0000 mg | ORAL_TABLET | Freq: Three times a day (TID) | ORAL | 0 refills | Status: DC | PRN

## 2019-04-13 MED ORDER — SODIUM CHLORIDE 0.9% FLUSH: 3.0000 mL | Freq: Once | INTRAVENOUS | Status: DC

## 2019-04-13 MED ORDER — ONDANSETRON 4 MG PO TBDP
4.0000 mg | ORAL_TABLET | Freq: Once | ORAL | Status: AC
  Administered 2019-04-13: 4 mg via ORAL
  Filled 2019-04-13: qty 1

## 2019-04-13 NOTE — ED Triage Notes (Addendum)
Pt comes via POV from work with c/o nausea. Pt states was headed to work and became nauseated and sweaty.  Pt denies any CP, SOB or abdominal pain. Pt states he has not been around anyone sick with covid.  Pt denies any current pain.

## 2019-04-13 NOTE — ED Provider Notes (Signed)
[facility] Emergency Department Provider Note   ____________________________________________   First MD Initiated Contact with Patient 04/13/19 2148     (approximate)  I have reviewed the triage vital signs and the nursing notes.   HISTORY  Chief Complaint Nausea    HPI Jesse Adams is a 49 y.o. male with no significant past medical history who presents to the ED complaining of nausea.  Patient reports that just prior to arrival he had sudden onset of nausea with some lightheadedness and diaphoresis.  He denies any associated chest pain, shortness of breath, or abdominal pain.  He did not vomit with the episode, but does endorse some recent diarrhea as well as a cough.  He has not had any fevers and denies any recent sick contacts.        History reviewed. No pertinent past medical history.  Patient Active Problem List   Diagnosis Date Noted  . Chest pain with low risk for cardiac etiology 10/05/2016    History reviewed. No pertinent surgical history.  Prior to Admission medications   Medication Sig Start Date End Date Taking? Authorizing Provider  ondansetron (ZOFRAN ODT) 4 MG disintegrating tablet Take 1 tablet (4 mg total) by mouth every 8 (eight) hours as needed for nausea or vomiting. 04/13/19   Chesley Noon, MD    Allergies Patient has no known allergies.  Family History  Problem Relation Age of Onset  . CAD Father 16       He had an MI with coronary disease. PCI    Social History Social History   Tobacco Use  . Smoking status: Never Smoker  . Smokeless tobacco: Never Used  Substance Use Topics  . Alcohol use: No  . Drug use: No    Review of Systems  Constitutional: No fever/chills Eyes: No visual changes. ENT: No sore throat. Cardiovascular: Denies chest pain.  Positive for lightheadedness. Respiratory: Denies shortness of breath. Gastrointestinal: No abdominal pain.  Positive for nausea, no vomiting.   Positive for diarrhea.  No constipation. Genitourinary: Negative for dysuria. Musculoskeletal: Negative for back pain. Skin: Negative for rash. Neurological: Negative for headaches, focal weakness or numbness.  ____________________________________________   PHYSICAL EXAM:  VITAL SIGNS: ED Triage Vitals  Enc Vitals Group     BP 04/13/19 1836 120/64     Pulse Rate 04/13/19 1836 87     Resp 04/13/19 1836 20     Temp 04/13/19 1836 99.3 F (37.4 C)     Temp Source 04/13/19 1836 Oral     SpO2 04/13/19 1836 97 %     Weight 04/13/19 1836 235 lb (106.6 kg)     Height 04/13/19 1836 6' (1.829 m)     Head Circumference --      Peak Flow --      Pain Score 04/13/19 1839 0     Pain Loc --      Pain Edu? --      Excl. in [place]? --     Constitutional: Alert and oriented. Eyes: Conjunctivae are normal. Head: Atraumatic. Nose: No congestion/rhinnorhea. Mouth/Throat: Mucous membranes are moist. Neck: Normal ROM Cardiovascular: Normal rate, regular rhythm. Grossly normal heart sounds. Respiratory: Normal respiratory effort.  No retractions. Lungs CTAB. Gastrointestinal: Soft and nontender. No distention. Genitourinary: deferred Musculoskeletal: No lower extremity tenderness nor edema. Neurologic:  Normal speech and language. No gross focal neurologic deficits are appreciated. Skin:  Skin is warm, dry and intact. No rash noted. Psychiatric: Mood and affect are normal.  Speech and behavior are normal.  ____________________________________________   LABS (all labs ordered are listed, but only abnormal results are displayed)  Labs Reviewed  COMPREHENSIVE METABOLIC PANEL - Abnormal; Notable for the following components:      Result Value   Glucose, Bld 128 (*)    All other components within normal limits  CBC - Abnormal; Notable for the following components:   WBC 3.5 (*)    All other components within normal limits  URINALYSIS, COMPLETE (UACMP) WITH MICROSCOPIC - Abnormal; Notable for  the following components:   Color, Urine YELLOW (*)    APPearance CLEAR (*)    Protein, ur 30 (*)    All other components within normal limits  NOVEL CORONAVIRUS, NAA (HOSP ORDER, SEND-OUT TO REF LAB; TAT 18-24 HRS)  LIPASE, BLOOD   ____________________________________________  EKG  ED ECG REPORT I, Blake Divine, the attending physician, personally viewed and interpreted this ECG.   Date: 04/13/2019  EKG Time: 22:13  Rate: 60  Rhythm: normal sinus rhythm  Axis: Normal  Intervals:none  ST&T Change: Benign early repolarization   PROCEDURES  Procedure(s) performed (including Critical Care):  Procedures   ____________________________________________   INITIAL IMPRESSION / ASSESSMENT AND PLAN / ED COURSE       49 year old male with no significant past medical history presents to the ED following sudden episode of nausea as well as lightheadedness and diaphoresis.  He denies any associated chest pain or shortness of breath, is calm and comfortable here in the ED.  Low suspicion for cardiac etiology, will screen EKG.  Vital signs are reassuring and lab work is unremarkable, LFTs and lipase within normal limits, UA without evidence of infection.  He does complain of some ongoing nausea, will give dose of Zofran.  Given his reported recent cough as well as diarrhea, will perform testing for COVID-19.  EKG with no evidence of arrhythmia or ischemia, is most consistent with benign early repolarization.  Patient is feeling better following dose of Zofran, COVID-19 testing was performed and patient advised on quarantine procedures.  Counseled patient to return to the ED for new or worsening symptoms.  Patient agrees with plan.      ____________________________________________   FINAL CLINICAL IMPRESSION(S) / ED DIAGNOSES  Final diagnoses:  Nausea  Diarrhea, unspecified type  Suspected COVID-19 virus infection     ED Discharge Orders         Ordered    ondansetron  (ZOFRAN ODT) 4 MG disintegrating tablet  Every 8 hours PRN     04/13/19 2219           Note:  This document was prepared using Dragon voice recognition software and may include unintentional dictation errors.   Blake Divine, MD 04/13/19 2223

## 2019-04-13 NOTE — ED Notes (Signed)
Pt given urine cup.

## 2019-04-15 LAB — NOVEL CORONAVIRUS, NAA (HOSP ORDER, SEND-OUT TO REF LAB; TAT 18-24 HRS): SARS-CoV-2, NAA: NOT DETECTED

## 2019-09-29 ENCOUNTER — Telehealth: Payer: Self-pay | Admitting: General Practice

## 2019-09-29 NOTE — Telephone Encounter (Signed)
Individual has been contacted regarding ED referral and ended the phone call. No further attempts to contact individual will be made. 

## 2019-10-26 ENCOUNTER — Emergency Department
Admission: EM | Admit: 2019-10-26 | Discharge: 2019-10-26 | Disposition: A | Discharge status: Home / Self Care | Payer: Self-pay | Attending: Emergency Medicine | Admitting: Emergency Medicine

## 2019-10-26 ENCOUNTER — Other Ambulatory Visit: Payer: Self-pay

## 2019-10-26 ENCOUNTER — Encounter: Payer: Self-pay | Admitting: *Deleted

## 2019-10-26 DIAGNOSIS — R5383 Other fatigue: Secondary | ICD-10-CM

## 2019-10-26 DIAGNOSIS — Z20822 Contact with and (suspected) exposure to covid-19: Secondary | ICD-10-CM

## 2019-10-26 LAB — RESP PANEL BY RT PCR (RSV, FLU A&B, COVID)
Influenza A by PCR: NEGATIVE
Influenza B by PCR: NEGATIVE
Respiratory Syncytial Virus by PCR: NEGATIVE
SARS Coronavirus 2 by RT PCR: NEGATIVE

## 2019-10-26 LAB — BASIC METABOLIC PANEL
Anion gap: 8 (ref 5–15)
BUN: 13 mg/dL (ref 6–20)
CO2: 27 mmol/L (ref 22–32)
Calcium: 8.7 mg/dL — ABNORMAL LOW (ref 8.9–10.3)
Chloride: 107 mmol/L (ref 98–111)
Creatinine, Ser: 1.15 mg/dL (ref 0.61–1.24)
GFR calc Af Amer: >60 mL/min (ref >60)
GFR calc non Af Amer: >60 mL/min (ref >60)
Glucose, Bld: 112 mg/dL — ABNORMAL HIGH (ref 70–99)
Potassium: 3.9 mmol/L (ref 3.5–5.1)
Sodium: 142 mmol/L (ref 135–145)

## 2019-10-26 LAB — CBC
HCT: 42.1 % (ref 39.0–52.0)
Hemoglobin: 13.8 g/dL (ref 13.0–17.0)
MCH: 28.8 pg (ref 26.0–34.0)
MCHC: 32.8 g/dL (ref 30.0–36.0)
MCV: 87.9 fL (ref 80.0–100.0)
Platelets: 212 10*3/uL (ref 150–400)
RBC: 4.79 MIL/uL (ref 4.22–5.81)
RDW: 12.5 % (ref 11.5–15.5)
WBC: 4.1 10*3/uL (ref 4.0–10.5)
nRBC: 0 % (ref 0.0–0.2)

## 2019-10-26 NOTE — ED Notes (Signed)
No answer when called several times from lobby 

## 2019-10-26 NOTE — ED Notes (Signed)
Pt alert and oriented X 4, stable for discharge. RR even and unlabored, color WNL. Discussed discharge instructions and follow up when appropriate. Instructed to follow up with ER for any life threatening symptoms or concerns that patient or family of patient may have  

## 2019-10-26 NOTE — ED Provider Notes (Signed)
[facility] Provider Note   ____________________________________________   First MD Initiated Contact with Patient 10/26/19 0802     (approximate)  I have reviewed the triage vital signs and the nursing notes.   HISTORY  Chief Complaint Fatigue    HPI Jesse Adams is a 49 y.o. 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  This time he went into the jail at about 7 PM as usual over the weekend, but was not brought back to his cell until about midnight and he reports while there he noted that they had a area segregated for Covid positive inmates.  He reports though that he was not in that section of the jail and they tested him before they brought him into the jail negative.  Since that has been feeling little bit fatigued.  No cough no runny nose no sore throat.  No other symptoms.  Just a bit tired and worried he might have been exposed to Covid.  Reports he came to have a Covid test   History reviewed. No pertinent past medical history.  Patient Active Problem List   Diagnosis Date Noted  . Chest pain with low risk for cardiac etiology 10/05/2016    History reviewed. No pertinent surgical history.  Prior to Admission medications   Medication Sig Start Date End Date Taking? Authorizing Provider  ondansetron (ZOFRAN ODT) 4 MG disintegrating tablet Take 1 tablet (4 mg total) by mouth every 8 (eight) hours as needed for nausea or vomiting. 04/13/19   Chesley Noon, MD    Allergies Patient has no known allergies.  Family History  Problem Relation Age of Onset  . CAD Father 67       He had an MI with coronary disease. PCI    Social History Social History   Tobacco Use  . Smoking status: Never Smoker  . Smokeless tobacco: Never Used  Substance Use Topics  . Alcohol use: No  . Drug use: No    Review  of Systems Constitutional: No fever/chills but does feel fatigued  ENT: No sore throat. Cardiovascular: Denies chest pain. Respiratory: Denies shortness of breath. Gastrointestinal: No nausea or vomiting.  No pain. Genitourinary: Negative for dysuria. Musculoskeletal: No muscle aches Skin: Negative for rash. Neurological: Negative for headaches or weakness    ____________________________________________   PHYSICAL EXAM:  VITAL SIGNS: ED Triage Vitals [10/26/19 0057]  Enc Vitals Group     BP 120/79     Pulse Rate 60     Resp 16     Temp 98.9 F (37.2 C)     Temp Source Oral     SpO2 100 %     Weight 228 lb (103.4 kg)     Height 5\' 11"  (1.803 m)     Head Circumference      Peak Flow      Pain Score 0     Pain Loc      Pain Edu?      Excl. in [place]?     Constitutional: Alert and oriented. Well appearing and in no acute distress. Eyes: Conjunctivae are normal. Head: Atraumatic. Nose: No congestion/rhinnorhea. Mouth/Throat: Mucous membranes are moist. Neck: No stridor.  Cardiovascular: Normal rate, regular rhythm. Grossly normal heart sounds.  Good peripheral circulation. Respiratory: Normal respiratory effort.  No retractions. Lungs CTAB. Gastrointestinal: Soft and nontender. No distention.  Musculoskeletal: No lower extremity tenderness nor edema. Neurologic:  Normal speech and language. No gross focal neurologic deficits are appreciated.  Skin:  Skin is warm, dry and intact. No rash noted. Psychiatric: Mood and affect are normal. Speech and behavior are normal.  Very alert, nontoxic reassuring normal examination ____________________________________________   LABS (all labs ordered are listed, but only abnormal results are displayed)  Labs Reviewed  BASIC METABOLIC PANEL - Abnormal; Notable for the following components:      Result Value   Glucose, Bld 112 (*)    Calcium 8.7 (*)    All other components within normal limits  RESP PANEL BY RT PCR (RSV, FLU A&B,  COVID)  CBC   ____________________________________________  EKG   ____________________________________________  RADIOLOGY   ____________________________________________   PROCEDURES  Procedure(s) performed: None  Procedures  Critical Care performed: No  ____________________________________________   INITIAL IMPRESSION / ASSESSMENT AND PLAN / ED COURSE  Pertinent labs & imaging results that were available during my care of the patient were reviewed by me and considered in my medical decision making (see chart for details).   Fatigue.  Reassuring work-up.  Patient reports potential Covid exposure.  Does not appear to been a high risk exposures he reports he is not in the same section of the gel as those that it tested positive.  He reports he tested negative at the jail on the weekend, and would like testing today.  No other clear etiology for his fatigue but very reassuring nontoxic exam.  Discussed with the patient careful return precautions including symptoms that could be suggestive of developing COVID-19-like illness.  [facility] was evaluated in [facility] on 10/26/2019 for the symptoms described in the history of present illness. He was evaluated in the context of the global COVID-19 pandemic, which necessitated consideration that the patient might be at risk for infection with the SARS-CoV-2 virus that causes COVID-19. Institutional protocols and algorithms that pertain to the evaluation of patients at risk for COVID-19 are in a state of rapid change based on information released by regulatory bodies including the CDC and federal and state organizations. These policies and algorithms were followed during the patient's care in the ED.  Return precautions and treatment recommendations and follow-up discussed with the patient who is agreeable with the plan.       ____________________________________________   FINAL CLINICAL IMPRESSION(S) / ED  DIAGNOSES  Final diagnoses:  Fatigue, unspecified type  Close exposure to COVID-19 virus        Note:  This document was prepared using Dragon voice recognition software and may include unintentional dictation errors       Sharyn Creamer, MD 10/26/19 [phone]

## 2019-10-26 NOTE — ED Triage Notes (Signed)
Pt to ED reporting increased fatigue, chills and nausea over the past couple days. Pt tested negative for COVID last Thursday but reports he has been exposed since that test and feeling worse. No cough or SOB reported.

## 2019-10-26 NOTE — ED Notes (Signed)
Pt ambulatory to desk and st "he was in bathroom"

## 2019-10-26 NOTE — ED Notes (Signed)
EDP at bedside  

## 2020-02-27 ENCOUNTER — Other Ambulatory Visit: Payer: Self-pay

## 2020-02-27 ENCOUNTER — Encounter: Payer: Self-pay | Admitting: Emergency Medicine

## 2020-02-27 ENCOUNTER — Emergency Department
Admission: EM | Admit: 2020-02-27 | Discharge: 2020-02-27 | Disposition: A | Discharge status: Home / Self Care | Payer: Worker's Compensation | Attending: Emergency Medicine | Admitting: Emergency Medicine

## 2020-02-27 DIAGNOSIS — Y990 Civilian activity done for income or pay: Secondary | ICD-10-CM | POA: Diagnosis not present

## 2020-02-27 DIAGNOSIS — S0501XA Injury of conjunctiva and corneal abrasion without foreign body, right eye, initial encounter: Secondary | ICD-10-CM | POA: Insufficient documentation

## 2020-02-27 DIAGNOSIS — H5711 Ocular pain, right eye: Secondary | ICD-10-CM | POA: Diagnosis present

## 2020-02-27 DIAGNOSIS — X58XXXA Exposure to other specified factors, initial encounter: Secondary | ICD-10-CM | POA: Insufficient documentation

## 2020-02-27 DIAGNOSIS — Y9389 Activity, other specified: Secondary | ICD-10-CM | POA: Diagnosis not present

## 2020-02-27 MED ORDER — TETRACAINE HCL 0.5 % OP SOLN
2.0000 [drp] | Freq: Once | OPHTHALMIC | Status: AC
  Administered 2020-02-27: 2 [drp] via OPHTHALMIC
  Filled 2020-02-27: qty 4

## 2020-02-27 MED ORDER — IBUPROFEN 600 MG PO TABS
600.0000 mg | ORAL_TABLET | Freq: Once | ORAL | Status: AC
  Administered 2020-02-27: 600 mg via ORAL
  Filled 2020-02-27: qty 1

## 2020-02-27 MED ORDER — FLUORESCEIN SODIUM 1 MG OP STRP
1.0000 | ORAL_STRIP | Freq: Once | OPHTHALMIC | Status: AC
  Administered 2020-02-27: 1 via OPHTHALMIC
  Filled 2020-02-27: qty 1

## 2020-02-27 MED ORDER — ERYTHROMYCIN 5 MG/GM OP OINT
TOPICAL_OINTMENT | Freq: Once | OPHTHALMIC | Status: AC
  Administered 2020-02-27: 1 via OPHTHALMIC
  Filled 2020-02-27: qty 1

## 2020-02-27 NOTE — ED Provider Notes (Signed)
[facility] Emergency Department Provider Note  ____________________________________________  Time seen: Approximately 6:50 AM  I have reviewed the triage vital signs and the nursing notes.   HISTORY  Chief Complaint Eye Injury and Foreign Body in Eye   HPI Jesse Adams is a 49 y.o. male presents for evaluation of right eye pain.  Patient reports that he was cleaning a machine with metal debris in it when a piece of metal flew into his right eye.  He feels like it stuck under his eyelid.  He is complaining of pain in his right eye that started after that.  The machine was off when the cleaning was happening.  He was wearing protective goggles.  He denies any photophobia or changes in vision, no headache, nausea or vomiting.   History reviewed. No pertinent past medical history.  Patient Active Problem List   Diagnosis Date Noted  . Chest pain with low risk for cardiac etiology 10/05/2016    History reviewed. No pertinent surgical history.  Prior to Admission medications   Medication Sig Start Date End Date Taking? Authorizing Provider  ondansetron (ZOFRAN ODT) 4 MG disintegrating tablet Take 1 tablet (4 mg total) by mouth every 8 (eight) hours as needed for nausea or vomiting. 04/13/19   Chesley Noon, MD    Allergies Patient has no known allergies.  Family History  Problem Relation Age of Onset  . CAD Father 30       He had an MI with coronary disease. PCI    Social History Social History   Tobacco Use  . Smoking status: Never Smoker  . Smokeless tobacco: Never Used  Substance Use Topics  . Alcohol use: Yes    Comment: couple beers on weekend  . Drug use: No    Review of Systems  Constitutional: Negative for fever. Eyes: Negative for visual changes. + R eye pain ENT: Negative for sore throat.  Neck: No neck pain  Cardiovascular: Negative for chest pain. Respiratory: Negative for shortness of breath. Gastrointestinal:  Negative for abdominal pain, vomiting or diarrhea. Genitourinary: Negative for dysuria. Musculoskeletal: Negative for back pain. Skin: Negative for rash. Neurological: Negative for headaches, weakness or numbness. Psych: No SI or HI  ____________________________________________   PHYSICAL EXAM:  VITAL SIGNS: ED Triage Vitals  Enc Vitals Group     BP 02/27/20 0233 125/67     Pulse Rate 02/27/20 0233 66     Resp 02/27/20 0233 16     Temp 02/27/20 0233 99 F (37.2 C)     Temp Source 02/27/20 0233 Oral     SpO2 02/27/20 0233 96 %     Weight 02/27/20 0234 235 lb (106.6 kg)     Height 02/27/20 0234 6' (1.829 m)     Head Circumference --      Peak Flow --      Pain Score 02/27/20 0234 8     Pain Loc --      Pain Edu? --      Excl. in GC? --     Constitutional: Alert and oriented. Well appearing and in no apparent distress. HEENT:      Head: Normocephalic and atraumatic.    EYE EXAM: EOMI and not painful. PERRL bilaterally. Intact consensual light reflex. No photophobia. Conjunctivae is erythematous on the right. Sclerae in anicteric. Eye lid everted and no foreign objects or stye observed. Visual fields are intact. Visual acuity is 20/20 bilaterally. No stye or chalazion. Ocular pressure 12, 12,  12 on the R eye. Large area of corneal abrasion on the lateral aspect of the R eye. R eyelid is swollen.       Mouth/Throat: Mucous membranes are moist.       Neck: Supple with no signs of meningismus. Cardiovascular: Regular rate and rhythm.  Respiratory: Normal respiratory effort.  Neurologic: Normal speech and language. Face is symmetric. Moving all extremities. No gross focal neurologic deficits are appreciated. Skin: Skin is warm, dry and intact. No rash noted. Psychiatric: Mood and affect are normal. Speech and behavior are normal.  ____________________________________________   LABS (all labs ordered are listed, but only abnormal results are displayed)  Labs Reviewed - No data  to display ____________________________________________  EKG  none  ____________________________________________  RADIOLOGY  none  ____________________________________________   PROCEDURES  Procedure(s) performed: None Procedures Critical Care performed:  None ____________________________________________   INITIAL IMPRESSION / ASSESSMENT AND PLAN / ED COURSE   49 y.o. male presents for evaluation of right eye pain.  Patient with a large right-sided corneal abrasion possibly from the retained foreign body under the right eyelid.  Eyelid was everted and no foreign body was observed at this time.  He does have some swelling of the eyelid.  Exam as described above.  Patient was started on erythromycin and [facility].  Discussed my standard return precautions      _____________________________________________ Please note:  Patient was evaluated in Emergency Department today for the symptoms described in the history of present illness. Patient was evaluated in the context of the global COVID-19 pandemic, which necessitated consideration that the patient might be at risk for infection with the SARS-CoV-2 virus that causes COVID-19. Institutional protocols and algorithms that pertain to the evaluation of patients at risk for COVID-19 are in a state of rapid change based on information released by regulatory bodies including the CDC and federal and state organizations. These policies and algorithms were followed during the patient's care in the ED.  Some ED evaluations and interventions may be delayed as a result of limited staffing during the pandemic.   [place] Controlled Substance Database was reviewed by me. ____________________________________________   FINAL CLINICAL IMPRESSION(S) / ED DIAGNOSES   Final diagnoses:  Abrasion of right cornea, initial encounter      NEW MEDICATIONS STARTED DURING THIS VISIT:  ED Discharge Orders    None        Note:  This document was prepared using Dragon voice recognition software and may include unintentional dictation errors.Don Perking, [place], MD 02/27/20 [phone]

## 2020-02-27 NOTE — ED Triage Notes (Signed)
Pt to ED from work c/o right eye injury.  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  States can see with some blurriness in right eye, pain at the top of eye.  States feels like FB is moving around.  Pt filing WC, works for [employer].  This RN spoke to Advice worker, BJ at [phone], who states needs UDS.

## 2020-02-27 NOTE — Discharge Instructions (Addendum)
Apply erythromycin to your right eye every hour until seen at the eye clinic this morning. Go straight to the eye clinic and tell them the ER sent you. They open at 8AM.

## 2020-04-01 ENCOUNTER — Other Ambulatory Visit: Payer: Self-pay

## 2020-04-01 ENCOUNTER — Emergency Department: Payer: Self-pay

## 2020-04-01 ENCOUNTER — Emergency Department
Admission: EM | Admit: 2020-04-01 | Discharge: 2020-04-01 | Disposition: A | Discharge status: Home / Self Care | Payer: Self-pay | Attending: Emergency Medicine | Admitting: Emergency Medicine

## 2020-04-01 DIAGNOSIS — R059 Cough, unspecified: Secondary | ICD-10-CM

## 2020-04-01 DIAGNOSIS — J208 Acute bronchitis due to other specified organisms: Secondary | ICD-10-CM | POA: Insufficient documentation

## 2020-04-01 MED ORDER — PREDNISONE 20 MG PO TABS
60.0000 mg | ORAL_TABLET | Freq: Once | ORAL | Status: AC
  Administered 2020-04-01: 60 mg via ORAL
  Filled 2020-04-01: qty 3

## 2020-04-01 MED ORDER — ALBUTEROL SULFATE HFA 108 (90 BASE) MCG/ACT IN AERS: 2.0000 | INHALATION_SPRAY | Freq: Four times a day (QID) | RESPIRATORY_TRACT | 0 refills | Status: DC | PRN

## 2020-04-01 MED ORDER — AZITHROMYCIN 500 MG PO TABS
500.0000 mg | ORAL_TABLET | Freq: Once | ORAL | Status: AC
  Administered 2020-04-01: 500 mg via ORAL
  Filled 2020-04-01: qty 1

## 2020-04-01 MED ORDER — BENZONATATE 100 MG PO CAPS: ORAL_CAPSULE | ORAL | 0 refills | Status: AC

## 2020-04-01 MED ORDER — PREDNISONE 20 MG PO TABS: 40.0000 mg | ORAL_TABLET | Freq: Every day | ORAL | 0 refills | Status: AC

## 2020-04-01 MED ORDER — AZITHROMYCIN 250 MG PO TABS: 250.0000 mg | ORAL_TABLET | Freq: Every day | ORAL | 0 refills | Status: AC

## 2020-04-01 NOTE — ED Triage Notes (Signed)
Pt endorses covid + test on 12/29, is now out of quarantine and had a negative test on Wednesday, now cough is worsening causing headache and throat pain.

## 2020-04-01 NOTE — Discharge Instructions (Addendum)
Your exam and x-ray are consistent with resolving pneumonia secondary to your recent COVID diagnosis.  Take the prescription medications as directed.  Continue over-the-counter cough and cold medicine as needed.  Rest and hydrate as necessary.  Follow-up with the [facility] for ongoing symptoms.  Return to the ED for Worsening symptoms.

## 2020-04-01 NOTE — ED Provider Notes (Signed)
[facility] Emergency Department Provider Note ____________________________________________  Time seen: 1025  I have reviewed the triage vital signs and the nursing notes.  HISTORY  Chief Complaint  Cough  HPI Jesse Adams is a 50 y.o. male presents himself to the ED for worsening cough, SOB, headache, and sore throat pain.  Patient tested positive for COVID on 12/29.  He was on appropriate quarantine, and endorses a negative COVID test on 1/5.  He presents for evaluation of his cough.  He denies any other symptoms at this time.  He was set to return to work on Wednesday.  Patient reports onset of his symptoms a couple days after his initial COVID vaccine.  He reports contact with a COVID-positive individual a few days after his vaccine.  History reviewed. No pertinent past medical history.  Patient Active Problem List   Diagnosis Date Noted  . Chest pain with low risk for cardiac etiology 10/05/2016    History reviewed. No pertinent surgical history.  Prior to Admission medications   Medication Sig Start Date End Date Taking? Authorizing Provider  albuterol (VENTOLIN HFA) 108 (90 Base) MCG/ACT inhaler Inhale 2 puffs into the lungs every 6 (six) hours as needed for shortness of breath. 04/01/20  Yes Shahd Occhipinti, Charlesetta Ivory, PA-C  azithromycin (ZITHROMAX Z-PAK) 250 MG tablet Take 1 tablet (250 mg total) by mouth daily for 4 days. 04/02/20 04/06/20 Yes Granvil Djordjevic, Charlesetta Ivory, PA-C  benzonatate (TESSALON PERLES) 100 MG capsule Take 1-2 tabs TID prn cough 04/01/20  Yes Arayah Krouse, Charlesetta Ivory, PA-C  predniSONE (DELTASONE) 20 MG tablet Take 2 tablets (40 mg total) by mouth daily with breakfast for 5 days. 04/02/20 04/07/20 Yes Tam Savoia, Charlesetta Ivory, PA-C  ondansetron (ZOFRAN ODT) 4 MG disintegrating tablet Take 1 tablet (4 mg total) by mouth every 8 (eight) hours as needed for nausea or vomiting. 04/13/19   Chesley Noon, MD    Allergies Patient has no known  allergies.  Family History  Problem Relation Age of Onset  . CAD Father 72       He had an MI with coronary disease. PCI    Social History Social History   Tobacco Use  . Smoking status: Never Smoker  . Smokeless tobacco: Never Used  Substance Use Topics  . Alcohol use: Yes    Comment: couple beers on weekend  . Drug use: No    Review of Systems  Constitutional: Negative for fever. Eyes: Negative for visual changes. ENT: Negative for sore throat. Cardiovascular: Negative for chest pain. Respiratory: Positive for shortness of breath. Reports cough as above Gastrointestinal: Negative for abdominal pain, vomiting and diarrhea. Genitourinary: Negative for dysuria. Musculoskeletal: Negative for back pain. Skin: Negative for rash. Neurological: Negative for headaches, focal weakness or numbness. ____________________________________________  PHYSICAL EXAM:  VITAL SIGNS: ED Triage Vitals  Enc Vitals Group     BP 04/01/20 2125 111/69     Pulse Rate 04/01/20 2125 94     Resp 04/01/20 2125 18     Temp 04/01/20 2125 99.8 F (37.7 C)     Temp Source 04/01/20 2125 Oral     SpO2 04/01/20 2125 97 %     Weight 04/01/20 2128 237 lb (107.5 kg)     Height 04/01/20 2128 6' (1.829 m)     Head Circumference --      Peak Flow --      Pain Score 04/01/20 2127 9     Pain Loc --  Pain Edu? --      Excl. in GC? --     Constitutional: Alert and oriented. Well appearing and in no distress. Head: Normocephalic and atraumatic. Eyes: Conjunctivae are normal. Normal extraocular movements Cardiovascular: Normal rate, regular rhythm. Normal distal pulses. Respiratory: Normal respiratory effort. No wheezes/rales/rhonchi. Intermittent cough Musculoskeletal: Nontender with normal range of motion in all extremities.  Neurologic:  Normal gait without ataxia. Normal speech and language. No gross focal neurologic deficits are appreciated. Skin:  Skin is warm, dry and intact. No rash  noted. ____________________________________________   RADIOLOGY  CXR  IMPRESSION: 1. Ill-defined vague bilateral lung opacities likely sequela of COVID pneumonia. 2. Bronchial thickening with borderline hyperinflation can be seen in the setting of asthma or bronchitis. ____________________________________________  PROCEDURES  Azithromycin 500 mg PO Prednisone 60 mg PO  Procedures ____________________________________________  INITIAL IMPRESSION / ASSESSMENT AND PLAN / ED COURSE  DDX: Covid, CAP, viral URI, bronchitis  Patient to ED evaluation of ongoing worsening cough about 10 days status post diagnosis with COVID.  Patient had received his first vaccine a couple days before exposure and symptom onset.  He has been taking over-the-counter cough medicine with limited benefit.  His exam is overall evaluation at this time her chest x-ray does reveal some residual bilateral changes consistent with likely COVID-pneumonia.  He also shows some bronchial thickening consistent with his history of bronchitis.  Patient will be treated empirically with azithromycin, prednisone, and a prescription for Tessalon Perles as well as an albuterol inhaler provided.  He will follow[facility] with the [facility] for ongoing symptoms.  Turn precautions have been discussed.   A work note is provided for the remainder of this week.   [name] was evaluated in Emergency Department on 04/01/2020 for the symptoms described in the history of present illness. He was evaluated in the context of the global COVID-19 pandemic, which necessitated consideration that the patient might be at risk for infection with the SARS-CoV-2 virus that causes COVID-19. Institutional protocols and algorithms that pertain to the evaluation of patients at risk for COVID-19 are in a state of rapid change based on information released by regulatory bodies including the CDC and federal and state organizations. These policies and algorithms  were followed during the patient's care in the ED. ____________________________________________  FINAL CLINICAL IMPRESSION(S) / ED DIAGNOSES  Final diagnoses:  Cough  Acute bronchitis due to other specified organisms      Carmon Sahli, Charlesetta Ivory, PA-C 04/01/20 2327    Jene Every, MD 04/01/20 2334

## 2020-05-24 ENCOUNTER — Other Ambulatory Visit: Payer: Self-pay

## 2020-05-24 ENCOUNTER — Encounter: Payer: Self-pay | Admitting: Emergency Medicine

## 2020-05-24 ENCOUNTER — Emergency Department: Payer: No Typology Code available for payment source

## 2020-05-24 ENCOUNTER — Emergency Department
Admission: EM | Admit: 2020-05-24 | Discharge: 2020-05-24 | Disposition: A | Discharge status: Home / Self Care | Payer: No Typology Code available for payment source | Attending: Physician Assistant | Admitting: Physician Assistant

## 2020-05-24 DIAGNOSIS — M70831 Other soft tissue disorders related to use, overuse and pressure, right forearm: Secondary | ICD-10-CM

## 2020-05-24 DIAGNOSIS — M25531 Pain in right wrist: Secondary | ICD-10-CM | POA: Diagnosis present

## 2020-05-24 DIAGNOSIS — M6798 Unspecified disorder of synovium and tendon, other site: Secondary | ICD-10-CM | POA: Diagnosis not present

## 2020-05-24 DIAGNOSIS — Y9389 Activity, other specified: Secondary | ICD-10-CM | POA: Diagnosis not present

## 2020-05-24 DIAGNOSIS — M70941 Unspecified soft tissue disorder related to use, overuse and pressure, right hand: Secondary | ICD-10-CM | POA: Diagnosis not present

## 2020-05-24 DIAGNOSIS — M778 Other enthesopathies, not elsewhere classified: Secondary | ICD-10-CM

## 2020-05-24 MED ORDER — CYCLOBENZAPRINE HCL 5 MG PO TABS: 5.0000 mg | ORAL_TABLET | Freq: Every day | ORAL | 0 refills | Status: AC

## 2020-05-24 MED ORDER — NABUMETONE 750 MG PO TABS: 750.0000 mg | ORAL_TABLET | Freq: Two times a day (BID) | ORAL | 0 refills | Status: AC

## 2020-05-24 NOTE — ED Triage Notes (Signed)
Pt to ED via POV with c/o R wrist pain, pt states pain worse with turning of his hand, states pain shoots up his arm. Pt also c/o swelling to R hand intermittently, states works at [employer] with repeated movement for work.

## 2020-05-24 NOTE — ED Provider Notes (Signed)
[facility] Emergency Department Provider Note ____________________________________________  Time seen: 1715  I have reviewed the triage vital signs and the nursing notes.  HISTORY  Chief Complaint  Wrist Pain  HPI Jesse Adams is a 50 y.o. male right-handed male, presents to the ED for evaluation of  4 days of increasing right hand pain both at the volar wrist and the thumb.  Patient reports being on a job for about the last 5 months, but has noted over the last several days he has had some increased pain with performing a composite fist as well as flexion and extension of the wrist.  He denies any local trauma.  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  He presents for evaluation of his symptoms.  History reviewed. No pertinent past medical history.  Patient Active Problem List   Diagnosis Date Noted  . Chest pain with low risk for cardiac etiology 10/05/2016    History reviewed. No pertinent surgical history.  Prior to Admission medications   Medication Sig Start Date End Date Taking? Authorizing Provider  cyclobenzaprine (FLEXERIL) 5 MG tablet Take 1 tablet (5 mg total) by mouth at bedtime. 05/24/20  Yes Miche Loughridge, Charlesetta Ivory, PA-C  nabumetone (RELAFEN) 750 MG tablet Take 1 tablet (750 mg total) by mouth 2 (two) times daily for 15 days. 05/24/20 06/08/20 Yes Masayoshi Couzens, Charlesetta Ivory, PA-C  albuterol (VENTOLIN HFA) 108 (90 Base) MCG/ACT inhaler Inhale 2 puffs into the lungs every 6 (six) hours as needed for shortness of breath. 04/01/20   Koleton Duchemin, Charlesetta Ivory, PA-C  benzonatate (TESSALON PERLES) 100 MG capsule Take 1-2 tabs TID prn cough 04/01/20    Case Vassell, Charlesetta Ivory, PA-C  ondansetron (ZOFRAN ODT) 4 MG disintegrating tablet Take 1 tablet (4 mg total) by mouth every 8 (eight) hours as needed for nausea or vomiting. 04/13/19   Chesley Noon, MD    Allergies Patient has no known allergies.  Family History  Problem Relation Age of Onset  . CAD Father 66       He had an MI with coronary disease. PCI    Social History Social History   Tobacco Use  . Smoking status: Never Smoker  . Smokeless tobacco: Never Used  Substance Use Topics  . Alcohol use: Yes    Comment: couple beers on weekend  . Drug use: No    Review of Systems  Constitutional: Negative for fever. Eyes: Negative for visual changes. ENT: Negative for sore throat. Cardiovascular: Negative for chest pain. Respiratory: Negative for shortness of breath. Gastrointestinal: Negative for abdominal pain, vomiting and diarrhea. Genitourinary: Negative for dysuria. Musculoskeletal: Negative for back pain.  Right thumb and wrist pain as above. Skin: Negative for rash. Neurological: Negative for headaches, focal weakness or numbness. ____________________________________________  PHYSICAL EXAM:  VITAL SIGNS: ED Triage Vitals  Enc Vitals Group     BP 05/24/20 1625 99/65     Pulse Rate 05/24/20 1625 81     Resp 05/24/20 1625 20     Temp 05/24/20 1625 98.9 F (37.2 C)     Temp Source 05/24/20 1625 Oral  SpO2 05/24/20 1625 96 %     Weight 05/24/20 1621 245 lb (111.1 kg)     Height 05/24/20 1621 6' (1.829 m)     Head Circumference --      Peak Flow --      Pain Score 05/24/20 1621 9     Pain Loc --      Pain Edu? --      Excl. in GC? --     Constitutional: Alert and oriented. Well appearing and in no distress. Head: Normocephalic and atraumatic. Eyes: Conjunctivae are normal. Normal extraocular movements Cardiovascular: Normal rate, regular rhythm. Normal distal pulses and cap refill Respiratory: Normal respiratory effort.  Musculoskeletal: Right  hand without obvious deformity or dislocation.  Patient with normal composite fist although subjectively decreased secondary to tightness.  Patient is also mildly tender to palpation across the extensor tendon of the thumb.  There is a mildly positive Finkelstein on exam.  Nontender with normal range of motion in all extremities.  Neurologic: Cranial nerves II to XII grossly intact.  Normal gross sensation is appreciated.  Normal intrinsic and opposition testing on exam.  Negative carpal and cubital Tinel's. Normal speech and language. No gross focal neurologic deficits are appreciated. Skin:  Skin is warm, dry and intact. No rash noted. Psychiatric: Mood and affect are normal. Patient exhibits appropriate insight and judgment. ____________________________________________   RADIOLOGY  DG Right Hand  Negative  I, Alin Hutchins V Bacon-Laveyah Oriol, personally viewed and evaluated these images (plain radiographs) as part of my medical decision making, as well as reviewing the written report by the radiologist. ____________________________________________  PROCEDURES  Wrist cock-up splint  Procedures ____________________________________________  INITIAL IMPRESSION / ASSESSMENT AND PLAN / ED COURSE  DDX: deQuervain's tenosynovitis, wrist sprain, repetitive motion injury, wrist fracture  Patient ED evaluation of increasing report of wrist and hand fatigue as well as hand swelling.  He reports pain with active motion of the hand.  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  Patient's x-rays negative as I reviewed, for any acute fracture or dislocation.  Clinical picture is consistent with likely muscle strain secondary to overuse.  He may also have a mild component of thumb tendinitis.  He will be placed in a wrist cock-up splint for comfort.  Options for cyclobenzaprine and Relafen also provided.  Patient is also encouraged to follow-up with orthopedics or  his primary provider for ongoing symptoms peer return precautions have been discussed.  A work note is provided for duties as requested.   Gilman L Aleman was evaluated in Emergency Department on 05/24/2020 for the symptoms described in the history of present illness. He was evaluated in the context of the global COVID-19 pandemic, which necessitated consideration that the patient might be at risk for infection with the SARS-CoV-2 virus that causes COVID-19. Institutional protocols and algorithms that pertain to the evaluation of patients at risk for COVID-19 are in a state of rapid change based on information released by regulatory bodies including the CDC and federal and state organizations. These policies and algorithms were followed during the patient's care in the ED. ____________________________________________  FINAL CLINICAL IMPRESSION(S) / ED DIAGNOSES  Final diagnoses:  Repetitive motion disorder of right wrist  Tendinitis of thumb      Karmen Stabs, Charlesetta Ivory, PA-C 05/25/20 0003    Sharman Cheek, MD 05/25/20 2322

## 2020-05-24 NOTE — Discharge Instructions (Addendum)
Your exam and XR are normal, and consistent with tendinitis of the thumb and strain of the forearm muscles. Wear the splint as needed Follow-up with the [facility], your employer's provider for continued symptoms. You may also follow-up with Ortho as needed.

## 2020-08-30 ENCOUNTER — Emergency Department
Admission: EM | Admit: 2020-08-30 | Discharge: 2020-08-30 | Disposition: A | Discharge status: Home / Self Care | Payer: No Typology Code available for payment source | Attending: Emergency Medicine | Admitting: Emergency Medicine

## 2020-08-30 ENCOUNTER — Emergency Department: Payer: No Typology Code available for payment source

## 2020-08-30 DIAGNOSIS — R42 Dizziness and giddiness: Secondary | ICD-10-CM | POA: Insufficient documentation

## 2020-08-30 DIAGNOSIS — R0602 Shortness of breath: Secondary | ICD-10-CM | POA: Insufficient documentation

## 2020-08-30 DIAGNOSIS — R531 Weakness: Secondary | ICD-10-CM | POA: Insufficient documentation

## 2020-08-30 LAB — COMPREHENSIVE METABOLIC PANEL
ALT: 22 U/L (ref 0–44)
AST: 22 U/L (ref 15–41)
Albumin: 3.4 g/dL — ABNORMAL LOW (ref 3.5–5.0)
Alkaline Phosphatase: 37 U/L — ABNORMAL LOW (ref 38–126)
Anion gap: 5 (ref 5–15)
BUN: 16 mg/dL (ref 6–20)
CO2: 25 mmol/L (ref 22–32)
Calcium: 8.7 mg/dL — ABNORMAL LOW (ref 8.9–10.3)
Chloride: 109 mmol/L (ref 98–111)
Creatinine, Ser: 1.04 mg/dL (ref 0.61–1.24)
GFR, Estimated: >60 mL/min (ref >60)
Glucose, Bld: 106 mg/dL — ABNORMAL HIGH (ref 70–99)
Potassium: 3.9 mmol/L (ref 3.5–5.1)
Sodium: 139 mmol/L (ref 135–145)
Total Bilirubin: 1.7 mg/dL — ABNORMAL HIGH (ref 0.3–1.2)
Total Protein: 6.2 g/dL — ABNORMAL LOW (ref 6.5–8.1)

## 2020-08-30 LAB — URINALYSIS, COMPLETE (UACMP) WITH MICROSCOPIC
Bacteria, UA: NONE SEEN
Bilirubin Urine: NEGATIVE
Glucose, UA: NEGATIVE mg/dL
Hgb urine dipstick: NEGATIVE
Ketones, ur: NEGATIVE mg/dL
Leukocytes,Ua: NEGATIVE
Nitrite: NEGATIVE
Protein, ur: NEGATIVE mg/dL
Specific Gravity, Urine: 1.024 (ref 1.005–1.030)
pH: 5 (ref 5.0–8.0)

## 2020-08-30 LAB — TROPONIN I (HIGH SENSITIVITY)
Troponin I (High Sensitivity): 8 ng/L (ref <18)
Troponin I (High Sensitivity): 7 ng/L (ref <18)

## 2020-08-30 LAB — CBC WITH DIFFERENTIAL/PLATELET
Abs Immature Granulocytes: 0.01 10*3/uL (ref 0.00–0.07)
Basophils Absolute: 0 10*3/uL (ref 0.0–0.1)
Basophils Relative: 1 %
Eosinophils Absolute: 0.1 10*3/uL (ref 0.0–0.5)
Eosinophils Relative: 3 %
HCT: 38 % — ABNORMAL LOW (ref 39.0–52.0)
Hemoglobin: 12.9 g/dL — ABNORMAL LOW (ref 13.0–17.0)
Immature Granulocytes: 0 %
Lymphocytes Relative: 48 %
Lymphs Abs: 1.9 10*3/uL (ref 0.7–4.0)
MCH: 28.7 pg (ref 26.0–34.0)
MCHC: 33.9 g/dL (ref 30.0–36.0)
MCV: 84.6 fL (ref 80.0–100.0)
Monocytes Absolute: 0.4 10*3/uL (ref 0.1–1.0)
Monocytes Relative: 11 %
Neutro Abs: 1.4 10*3/uL — ABNORMAL LOW (ref 1.7–7.7)
Neutrophils Relative %: 37 %
Platelets: 216 10*3/uL (ref 150–400)
RBC: 4.49 MIL/uL (ref 4.22–5.81)
RDW: 12.5 % (ref 11.5–15.5)
WBC: 3.9 10*3/uL — ABNORMAL LOW (ref 4.0–10.5)
nRBC: 0 % (ref 0.0–0.2)

## 2020-08-30 MED ORDER — SODIUM CHLORIDE 0.9 % IV BOLUS
500.0000 mL | Freq: Once | INTRAVENOUS | Status: AC
  Administered 2020-08-30: 500 mL via INTRAVENOUS

## 2020-08-30 NOTE — ED Triage Notes (Signed)
See paper chart for downtime 

## 2020-08-30 NOTE — Discharge Instructions (Addendum)
Return to the ER for worsening symptoms, persistent vomiting, difficulty breathing or other concerns. °

## 2020-08-30 NOTE — ED Provider Notes (Signed)
[facility] Emergency Department Provider Note   ____________________________________________   Event Date/Time   First MD Initiated Contact with Patient 08/30/20 [phone]     (approximate)  I have reviewed the triage vital signs and the nursing notes.   HISTORY  Chief Complaint Shortness of Breath    HPI Jesse Adams is a 50 y.o. 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  Patient had been at his shift for a few hours when he felt lightheaded and short of breath.  Denies associated chest pain, abdominal pain, nausea or vomiting.  Denies recent fever or cough.  Had a COVID exposure and tested negative 10 days ago.  Does not wish repeat testing.  Not orthostatic for EMS.     Past medical history None  Patient Active Problem List   Diagnosis Date Noted   Chest pain with low risk for cardiac etiology 10/05/2016    No past surgical history on file.  Prior to Admission medications   Medication Sig Start Date End Date Taking? Authorizing Provider  albuterol (VENTOLIN HFA) 108 (90 Base) MCG/ACT inhaler Inhale 2 puffs into the lungs every 6 (six) hours as needed for shortness of breath. 04/01/20   Menshew, Charlesetta Ivory, PA-C  benzonatate (TESSALON PERLES) 100 MG capsule Take 1-2 tabs TID prn cough 04/01/20   Menshew, Charlesetta Ivory, PA-C  cyclobenzaprine (FLEXERIL) 5 MG tablet Take 1 tablet (5 mg total) by mouth at bedtime. 05/24/20   Menshew, Charlesetta Ivory, PA-C  ondansetron (ZOFRAN ODT) 4 MG disintegrating tablet Take 1 tablet (4 mg total) by mouth every 8 (eight) hours as needed for nausea or vomiting. 04/13/19   Chesley Noon, MD    Allergies Patient has no known allergies.  Family History  Problem Relation Age of Onset   CAD Father 64       He had an MI with coronary disease. PCI    Social History Social History   Tobacco Use   Smoking status: Never    Smokeless tobacco: Never  Substance Use Topics   Alcohol use: Yes    Comment: couple beers on weekend   Drug use: No    Review of Systems  Constitutional: No fever/chills Eyes: No visual changes. ENT: No sore throat. Cardiovascular: Denies chest pain. Respiratory: Positive for shortness of breath. Gastrointestinal: No abdominal pain.  No nausea, no vomiting.  No diarrhea.  No constipation. Genitourinary: Negative for dysuria. Musculoskeletal: Negative for back pain. Skin: Negative for rash. Neurological: Positive for lightheadedness.  Negative for headaches, focal weakness or numbness.   ____________________________________________   PHYSICAL EXAM:  VITAL SIGNS: ED Triage Vitals  Enc Vitals Group     BP      Pulse      Resp      Temp      Temp src      SpO2      Weight      Height      Head Circumference      Peak Flow      Pain Score      Pain Loc      Pain Edu?      Excl. in GC?     Constitutional: Asleep, awakened for exam.  Alert and oriented. Well appearing and in no acute distress. Eyes: Conjunctivae are normal. PERRL. EOMI. Head: Atraumatic. Nose: No congestion/rhinnorhea. Mouth/Throat: Mucous membranes are moist.  Neck: No stridor.   Cardiovascular: Normal rate, regular rhythm. Grossly normal heart sounds.  Good peripheral circulation. Respiratory: Normal respiratory effort.  No retractions. Lungs CTAB. Gastrointestinal: Soft and nontender to light or deep palpation. No distention. No abdominal bruits. No CVA tenderness. Musculoskeletal: No lower extremity tenderness nor edema.  No joint effusions. Neurologic: Alert and oriented x3.  CN II to XII grossly intact normal speech and language. No gross focal neurologic deficits are appreciated. No gait instability. Skin:  Skin is warm, dry and intact. No rash noted. Psychiatric: Mood and affect are normal. Speech and behavior are normal.  ____________________________________________   LABS (all labs  ordered are listed, but only abnormal results are displayed)  Labs Reviewed  CBC WITH DIFFERENTIAL/PLATELET - Abnormal; Notable for the following components:      Result Value   WBC 3.9 (*)    Hemoglobin 12.9 (*)    HCT 38.0 (*)    Neutro Abs 1.4 (*)    All other components within normal limits  COMPREHENSIVE METABOLIC PANEL - Abnormal; Notable for the following components:   Glucose, Bld 106 (*)    Calcium 8.7 (*)    Total Protein 6.2 (*)    Albumin 3.4 (*)    Alkaline Phosphatase 37 (*)    Total Bilirubin 1.7 (*)    All other components within normal limits  URINALYSIS, COMPLETE (UACMP) WITH MICROSCOPIC - Abnormal; Notable for the following components:   Color, Urine YELLOW (*)    APPearance CLEAR (*)    All other components within normal limits  TROPONIN I (HIGH SENSITIVITY)  TROPONIN I (HIGH SENSITIVITY)   ____________________________________________  EKG  ED ECG REPORT I, Mayerly Kaman J, the attending physician, personally viewed and interpreted this ECG.   Date: 08/30/2020  EKG Time: 0105  Rate: 63  Rhythm: normal EKG, normal sinus rhythm  Axis: Normal  Intervals:none  ST&T Change: Nonspecific  ____________________________________________  RADIOLOGY I, Hermena Swint J, personally viewed and evaluated these images (plain radiographs) as part of my medical decision making, as well as reviewing the written report by the radiologist.  ED MD interpretation: No acute cardiopulmonary process  Official radiology report(s): DG Chest 2 View  Result Date: 08/30/2020 CLINICAL DATA:  Shortness of breath, , cough today, non smoker, no surgeries EXAM: CHEST - 2 VIEW COMPARISON:  Chest x-ray 04/01/2020 10 p.m., CT chest 10/05/2017 FINDINGS: The heart size and mediastinal contours are within normal limits. No focal consolidation. No pulmonary edema. No pleural effusion. No pneumothorax. No acute osseous abnormality. IMPRESSION: No active cardiopulmonary disease. Electronically Signed    By: Tish Frederickson M.D.   On: 08/30/2020 03:25    ____________________________________________   PROCEDURES  Procedure(s) performed (including Critical Care):  .1-3 Lead EKG Interpretation  Date/Time: 08/30/2020 4:00 AM Performed by: Irean Hong, MD Authorized by: Irean Hong, MD     Interpretation: normal     ECG rate:  63   ECG rate assessment: normal     Rhythm: sinus rhythm     Ectopy: none     Conduction: normal   Comments:     Patient placed on cardiac monitor to evaluate for arrhythmias   ____________________________________________   INITIAL IMPRESSION / ASSESSMENT AND PLAN / ED COURSE  As part of my medical decision making, I reviewed the following data within the electronic MEDICAL RECORD NUMBER Nursing notes reviewed and incorporated, Labs reviewed, EKG interpreted, Old chart reviewed, Radiograph reviewed, and Notes from prior ED visits     50 year old male  presenting with lightheadedness 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 Differential includes, but is not limited to, viral syndrome, bronchitis including COPD exacerbation, pneumonia, reactive airway disease including asthma, CHF including exacerbation with or without pulmonary/interstitial edema, pneumothorax, ACS, thoracic trauma, and pulmonary embolism.   Laboratory results including 2 sets of troponins unremarkable.  Chest x-ray negative.  Patient feeling significantly better.  IV fluids for feeling a little dehydrated at work.  Will reassess.  Clinical Course as of 08/30/20 [id number]  Thu Aug 30, 2020  0613 Addendum on chart review: Patient feeling very well on discharge and was discharged in good and stable condition.  Strict return precautions were given.  Patient verbalized understanding agree with plan of care. [JS]    Clinical Course User Index [JS] Irean Hong, MD     ____________________________________________   FINAL CLINICAL IMPRESSION(S) / ED DIAGNOSES  Final diagnoses:  Weakness   Shortness of breath     ED Discharge Orders     None        Note:  This document was prepared using Dragon voice recognition software and may include unintentional dictation errors.    Irean Hong, MD 08/30/20 [phone]

## 2020-08-30 NOTE — ED Notes (Signed)
Patient is resting comfortably. 

## 2020-11-18 ENCOUNTER — Emergency Department: Payer: No Typology Code available for payment source

## 2020-11-18 ENCOUNTER — Encounter: Payer: Self-pay | Admitting: Radiology

## 2020-11-18 ENCOUNTER — Other Ambulatory Visit: Payer: Self-pay

## 2020-11-18 DIAGNOSIS — R079 Chest pain, unspecified: Secondary | ICD-10-CM | POA: Diagnosis present

## 2020-11-18 DIAGNOSIS — U071 COVID-19: Secondary | ICD-10-CM | POA: Insufficient documentation

## 2020-11-18 LAB — COMPREHENSIVE METABOLIC PANEL
ALT: 24 U/L (ref 0–44)
AST: 26 U/L (ref 15–41)
Albumin: 3.5 g/dL (ref 3.5–5.0)
Alkaline Phosphatase: 49 U/L (ref 38–126)
Anion gap: 6 (ref 5–15)
BUN: 15 mg/dL (ref 6–20)
CO2: 29 mmol/L (ref 22–32)
Calcium: 8.8 mg/dL — ABNORMAL LOW (ref 8.9–10.3)
Chloride: 105 mmol/L (ref 98–111)
Creatinine, Ser: 1.19 mg/dL (ref 0.61–1.24)
GFR, Estimated: >60 mL/min (ref >60)
Glucose, Bld: 120 mg/dL — ABNORMAL HIGH (ref 70–99)
Potassium: 3.7 mmol/L (ref 3.5–5.1)
Sodium: 140 mmol/L (ref 135–145)
Total Bilirubin: 1.2 mg/dL (ref 0.3–1.2)
Total Protein: 6.6 g/dL (ref 6.5–8.1)

## 2020-11-18 LAB — CBC
HCT: 38.9 % — ABNORMAL LOW (ref 39.0–52.0)
Hemoglobin: 12.7 g/dL — ABNORMAL LOW (ref 13.0–17.0)
MCH: 27.8 pg (ref 26.0–34.0)
MCHC: 32.6 g/dL (ref 30.0–36.0)
MCV: 85.1 fL (ref 80.0–100.0)
Platelets: 221 10*3/uL (ref 150–400)
RBC: 4.57 MIL/uL (ref 4.22–5.81)
RDW: 12.8 % (ref 11.5–15.5)
WBC: 3.3 10*3/uL — ABNORMAL LOW (ref 4.0–10.5)
nRBC: 0 % (ref 0.0–0.2)

## 2020-11-18 LAB — TROPONIN I (HIGH SENSITIVITY): Troponin I (High Sensitivity): 10 ng/L (ref <18)

## 2020-11-18 NOTE — ED Triage Notes (Signed)
Pt states has been having chest tightness. Pt states has had a dry cough. Pt states chest tightness and cough has been present since Friday. Pt states has been having sinus tdrainage. Pt denies known fever, diarrhea, vomiting. Pt appears in no acute distress.

## 2020-11-19 ENCOUNTER — Emergency Department
Admission: EM | Admit: 2020-11-19 | Discharge: 2020-11-19 | Disposition: A | Discharge status: Home / Self Care | Payer: No Typology Code available for payment source | Attending: Emergency Medicine | Admitting: Emergency Medicine

## 2020-11-19 DIAGNOSIS — U071 COVID-19: Secondary | ICD-10-CM

## 2020-11-19 LAB — RESP PANEL BY RT-PCR (FLU A&B, COVID) ARPGX2
Influenza A by PCR: NEGATIVE
Influenza B by PCR: NEGATIVE
SARS Coronavirus 2 by RT PCR: POSITIVE — AB

## 2020-11-19 LAB — TROPONIN I (HIGH SENSITIVITY): Troponin I (High Sensitivity): 7 ng/L (ref <18)

## 2020-11-19 MED ORDER — ALBUTEROL SULFATE HFA 108 (90 BASE) MCG/ACT IN AERS: 2.0000 | INHALATION_SPRAY | RESPIRATORY_TRACT | 0 refills | Status: DC | PRN

## 2020-11-19 MED ORDER — PREDNISONE 20 MG PO TABS: 60.0000 mg | ORAL_TABLET | Freq: Every day | ORAL | 0 refills | Status: AC

## 2020-11-19 MED ORDER — NIRMATRELVIR/RITONAVIR (PAXLOVID)TABLET: 3.0000 | ORAL_TABLET | Freq: Two times a day (BID) | ORAL | 0 refills | Status: AC

## 2020-11-19 NOTE — Discharge Instructions (Addendum)
Take the prednisone as prescribed and finish the full 5-day course.  This will help with your breathing and chest tightness.  You may use the albuterol inhaler up to every 4 hours as needed for shortness of breath.  Take the Paxlovid combination medication as prescribed.  Return to the ER for new, worsening, or persistent severe shortness of breath, chest pain, or any other new or worsening symptoms that concern you.

## 2020-11-19 NOTE — ED Provider Notes (Signed)
[facility] Emergency Department Provider Note ____________________________________________   Event Date/Time   First MD Initiated Contact with Patient 11/19/20 [phone]     (approximate)  I have reviewed the triage vital signs and the nursing notes.   HISTORY  Chief Complaint Chest Pain    HPI Jesse Adams is a 50 y.o. male with no significant active medical problems who presents with chest congestion over the last 3 days, associated with mild shortness of breath, nonproductive cough, and some intermittent sharp chest pain around his sternum which he believes is related to coughing.  He has also had some nasal congestion and throat discomfort.  He denies any fever, vomiting, diarrhea.  History reviewed. No pertinent past medical history.  Patient Active Problem List   Diagnosis Date Noted   Chest pain with low risk for cardiac etiology 10/05/2016    No past surgical history on file.  Prior to Admission medications   Medication Sig Start Date End Date Taking? Authorizing Provider  albuterol (VENTOLIN HFA) 108 (90 Base) MCG/ACT inhaler Inhale 2 puffs into the lungs every 4 (four) hours as needed for wheezing or shortness of breath. 11/19/20  Yes Dionne Bucy, MD  nirmatrelvir/ritonavir EUA (PAXLOVID) 20 x 150 MG & 10 x 100MG  TABS Take 3 tablets by mouth 2 (two) times daily for 5 days. Patient GFR is >60. Take nirmatrelvir (150 mg) two tablets twice daily for 5 days and ritonavir (100 mg) one tablet twice daily for 5 days. 11/19/20 11/24/20 Yes 01/24/21, MD  predniSONE (DELTASONE) 20 MG tablet Take 3 tablets (60 mg total) by mouth daily with breakfast for 5 days. 11/19/20 11/24/20 Yes 01/24/21, MD  benzonatate (TESSALON PERLES) 100 MG capsule Take 1-2 tabs TID prn cough 04/01/20   Menshew, 05/30/20, PA-C  cyclobenzaprine (FLEXERIL) 5 MG tablet Take 1 tablet (5 mg total) by mouth at bedtime. 05/24/20   Menshew, 07/24/20,  PA-C  ondansetron (ZOFRAN ODT) 4 MG disintegrating tablet Take 1 tablet (4 mg total) by mouth every 8 (eight) hours as needed for nausea or vomiting. 04/13/19   04/15/19, MD    Allergies Patient has no known allergies.  Family History  Problem Relation Age of Onset   CAD Father 24       He had an MI with coronary disease. PCI    Social History Social History   Tobacco Use   Smoking status: Never   Smokeless tobacco: Never  Substance Use Topics   Alcohol use: Yes    Comment: couple beers on weekend   Drug use: No    Review of Systems  Constitutional: No fever. Eyes: No visual changes. ENT: Positive for nasal congestion Cardiovascular: Positive for chest pain. Respiratory: Positive shortness of breath. Gastrointestinal: No vomiting or diarrhea.  Genitourinary: Negative for dysuria.  Musculoskeletal: Negative for back pain. Skin: Negative for rash. Neurological: Negative for headache.   ____________________________________________   PHYSICAL EXAM:  VITAL SIGNS: ED Triage Vitals  Enc Vitals Group     BP 11/18/20 2220 111/75     Pulse Rate 11/18/20 2219 66     Resp 11/18/20 2219 20     Temp 11/18/20 2219 98.5 F (36.9 C)     Temp Source 11/18/20 2219 Oral     SpO2 11/18/20 2219 96 %     Weight 11/18/20 2219 260 lb (117.9 kg)     Height 11/18/20 2219 5\' 11"  (1.803 m)     Head Circumference --  Peak Flow --      Pain Score 11/18/20 2219 7     Pain Loc --      Pain Edu? --      Excl. in GC? --     Constitutional: Alert and oriented. Well appearing and in no acute distress. Eyes: Conjunctivae are normal.  Head: Atraumatic. Nose: No congestion/rhinnorhea. Mouth/Throat: Mucous membranes are moist.  Oropharynx clear. Neck: Normal range of motion.  Cardiovascular: Normal rate, regular rhythm. Grossly normal heart sounds.  Good peripheral circulation. Respiratory: Normal respiratory effort.  No retractions. Lungs CTAB. Gastrointestinal: No  distention.  Musculoskeletal: No lower extremity edema.  Extremities warm and well perfused.  Neurologic:  Normal speech and language. No gross focal neurologic deficits are appreciated.  Skin:  Skin is warm and dry. No rash noted. Psychiatric: Mood and affect are normal. Speech and behavior are normal.  ____________________________________________   LABS (all labs ordered are listed, but only abnormal results are displayed)  Labs Reviewed  RESP PANEL BY RT-PCR (FLU A&B, COVID) ARPGX2 - Abnormal; Notable for the following components:      Result Value   SARS Coronavirus 2 by RT PCR POSITIVE (*)    All other components within normal limits  CBC - Abnormal; Notable for the following components:   WBC 3.3 (*)    Hemoglobin 12.7 (*)    HCT 38.9 (*)    All other components within normal limits  COMPREHENSIVE METABOLIC PANEL - Abnormal; Notable for the following components:   Glucose, Bld 120 (*)    Calcium 8.8 (*)    All other components within normal limits  TROPONIN I (HIGH SENSITIVITY)  TROPONIN I (HIGH SENSITIVITY)   ____________________________________________  EKG  ED ECG REPORT I, Dionne Bucy, the attending physician, personally viewed and interpreted this ECG.  Date: 11/19/2020 EKG Time: 2224 Rate: 63 Rhythm: normal sinus rhythm QRS Axis: normal Intervals: normal ST/T Wave abnormalities: normal Narrative Interpretation: no evidence of acute ischemia  ____________________________________________  RADIOLOGY  Chest x-ray interpreted by me shows no focal consolidation or edema  ____________________________________________   PROCEDURES  Procedure(s) performed: No  Procedures  Critical Care performed: No ____________________________________________   INITIAL IMPRESSION / ASSESSMENT AND PLAN / ED COURSE  Pertinent labs & imaging results that were available during my care of the patient were reviewed by me and considered in my medical decision making  (see chart for details).   50 year old male with no active medical problems presents with cough as well as mild shortness of breath, URI symptoms, and atypical chest pain over the last 3 days.  On exam the patient is well-appearing.  His vital signs are normal.  The physical exam is unremarkable.  Chest x-ray shows no focal infiltrate or other acute abnormality.  EKG is nonischemic.  Lab work-up obtained from triage is unremarkable and troponins are negative x2.  Differential includes acute bronchitis, COVID-19 or other viral etiology, new onset COPD.  There is no clinical evidence for ACS.  I also do not suspect PE given the lack of tachycardia, hypoxia, or sustained chest pain.  The patient has no DVT symptoms.  We will obtain a COVID swab and reassess.  ----------------------------------------- 5:40 AM on 11/19/2020 -----------------------------------------  The COVID is positive.  The patient is oxygenating normally and is appropriate for discharge home.  Since he is within 3 days, we will prescribe Paxlovid as well as prednisone and albuterol for symptomatic treatment.  I counseled him on the results of the work-up.  Return precautions  given, and he expresses understanding.  ____________________________________________   FINAL CLINICAL IMPRESSION(S) / ED DIAGNOSES  Final diagnoses:  COVID-19      NEW MEDICATIONS STARTED DURING THIS VISIT:  New Prescriptions   ALBUTEROL (VENTOLIN HFA) 108 (90 BASE) MCG/ACT INHALER    Inhale 2 puffs into the lungs every 4 (four) hours as needed for wheezing or shortness of breath.   NIRMATRELVIR/RITONAVIR EUA (PAXLOVID) 20 X 150 MG & 10 X 100MG  TABS    Take 3 tablets by mouth 2 (two) times daily for 5 days. Patient GFR is >60. Take nirmatrelvir (150 mg) two tablets twice daily for 5 days and ritonavir (100 mg) one tablet twice daily for 5 days.   PREDNISONE (DELTASONE) 20 MG TABLET    Take 3 tablets (60 mg total) by mouth daily with breakfast for  5 days.     Note:  This document was prepared using Dragon voice recognition software and may include unintentional dictation errors.    , MD 11/19/20 [phone]

## 2021-02-11 ENCOUNTER — Encounter: Payer: Self-pay | Admitting: Emergency Medicine

## 2021-02-11 ENCOUNTER — Other Ambulatory Visit: Payer: Self-pay

## 2021-02-11 ENCOUNTER — Emergency Department
Admission: EM | Admit: 2021-02-11 | Discharge: 2021-02-11 | Disposition: A | Discharge status: Home / Self Care | Payer: No Typology Code available for payment source | Attending: Emergency Medicine | Admitting: Emergency Medicine

## 2021-02-11 ENCOUNTER — Emergency Department: Payer: No Typology Code available for payment source

## 2021-02-11 DIAGNOSIS — R0789 Other chest pain: Secondary | ICD-10-CM

## 2021-02-11 DIAGNOSIS — K219 Gastro-esophageal reflux disease without esophagitis: Secondary | ICD-10-CM | POA: Insufficient documentation

## 2021-02-11 DIAGNOSIS — R0602 Shortness of breath: Secondary | ICD-10-CM | POA: Insufficient documentation

## 2021-02-11 LAB — COMPREHENSIVE METABOLIC PANEL
ALT: 27 U/L (ref 0–44)
AST: 25 U/L (ref 15–41)
Albumin: 3.5 g/dL (ref 3.5–5.0)
Alkaline Phosphatase: 58 U/L (ref 38–126)
Anion gap: 7 (ref 5–15)
BUN: 14 mg/dL (ref 6–20)
CO2: 27 mmol/L (ref 22–32)
Calcium: 9.1 mg/dL (ref 8.9–10.3)
Chloride: 104 mmol/L (ref 98–111)
Creatinine, Ser: 0.93 mg/dL (ref 0.61–1.24)
GFR, Estimated: >60 mL/min (ref >60)
Glucose, Bld: 136 mg/dL — ABNORMAL HIGH (ref 70–99)
Potassium: 4.2 mmol/L (ref 3.5–5.1)
Sodium: 138 mmol/L (ref 135–145)
Total Bilirubin: 1.5 mg/dL — ABNORMAL HIGH (ref 0.3–1.2)
Total Protein: 7.2 g/dL (ref 6.5–8.1)

## 2021-02-11 LAB — CBC
HCT: 43.3 % (ref 39.0–52.0)
Hemoglobin: 13.9 g/dL (ref 13.0–17.0)
MCH: 27.3 pg (ref 26.0–34.0)
MCHC: 32.1 g/dL (ref 30.0–36.0)
MCV: 85.1 fL (ref 80.0–100.0)
Platelets: 231 10*3/uL (ref 150–400)
RBC: 5.09 MIL/uL (ref 4.22–5.81)
RDW: 12.6 % (ref 11.5–15.5)
WBC: 4.9 10*3/uL (ref 4.0–10.5)
nRBC: 0 % (ref 0.0–0.2)

## 2021-02-11 LAB — TROPONIN I (HIGH SENSITIVITY): Troponin I (High Sensitivity): 7 ng/L (ref <18)

## 2021-02-11 LAB — BRAIN NATRIURETIC PEPTIDE: B Natriuretic Peptide: 14.1 pg/mL (ref 0.0–100.0)

## 2021-02-11 LAB — LIPASE, BLOOD: Lipase: 30 U/L (ref 11–51)

## 2021-02-11 MED ORDER — PANTOPRAZOLE SODIUM 40 MG PO TBEC: 40.0000 mg | DELAYED_RELEASE_TABLET | Freq: Every day | ORAL | 1 refills | Status: AC

## 2021-02-11 NOTE — ED Triage Notes (Signed)
Pt to ED via POV with c/o substernal chest tightness, pt states tightness only at night when he is laying flat, states is affecting his ability to sleep, also c/o SOB, denies radiation at this time.

## 2021-02-11 NOTE — ED Provider Notes (Signed)
[facility] Emergency Department Provider Note   ____________________________________________    I have reviewed the triage vital signs and the nursing notes.   HISTORY  Chief Complaint Chest Pain     HPI Jesse Adams is a 50 y.o. male who presents with complaints of chest pain.  Patient reports this is been occurring over the last week, he states it only happens when he lies down in bed, he does scribes a discomfort in his central chest and occasionally in the back of his throat.  Does not report a history of acid reflux in the past.  Denies shortness of breath.  No cough fever chills.  No diaphoresis.  No personal history of heart disease.  Asymptomatic at this time   History reviewed. No pertinent past medical history.  Patient Active Problem List   Diagnosis Date Noted   Chest pain with low risk for cardiac etiology 10/05/2016    History reviewed. No pertinent surgical history.  Prior to Admission medications   Medication Sig Start Date End Date Taking? Authorizing Provider  pantoprazole (PROTONIX) 40 MG tablet Take 1 tablet (40 mg total) by mouth daily. 02/11/21 02/11/22 Yes Jene Every, MD  albuterol (VENTOLIN HFA) 108 (90 Base) MCG/ACT inhaler Inhale 2 puffs into the lungs every 4 (four) hours as needed for wheezing or shortness of breath. 11/19/20   Dionne Bucy, MD  benzonatate (TESSALON PERLES) 100 MG capsule Take 1-2 tabs TID prn cough 04/01/20   Menshew, Charlesetta Ivory, PA-C  cyclobenzaprine (FLEXERIL) 5 MG tablet Take 1 tablet (5 mg total) by mouth at bedtime. 05/24/20   Menshew, Charlesetta Ivory, PA-C  ondansetron (ZOFRAN ODT) 4 MG disintegrating tablet Take 1 tablet (4 mg total) by mouth every 8 (eight) hours as needed for nausea or vomiting. 04/13/19   Chesley Noon, MD     Allergies Patient has no known allergies.  Family History  Problem Relation Age of Onset   CAD Father 29       He had an MI with coronary  disease. PCI    Social History Social History   Tobacco Use   Smoking status: Never   Smokeless tobacco: Never  Substance Use Topics   Alcohol use: Yes    Comment: couple beers on weekend   Drug use: No    Review of Systems  Constitutional: No fever/chills Eyes: No visual changes.  ENT: No sore throat. Cardiovascular: Denies chest pain. Respiratory: Denies shortness of breath. Gastrointestinal: No abdominal pain.  No nausea, no vomiting.   Genitourinary: Negative for dysuria. Musculoskeletal: Negative for back pain. Skin: Negative for rash. Neurological: Negative for headaches or weakness   ____________________________________________   PHYSICAL EXAM:  VITAL SIGNS: ED Triage Vitals [02/11/21 0605]  Enc Vitals Group     BP (!) 148/71     Pulse Rate 76     Resp 20     Temp 99 F (37.2 C)     Temp Source Oral     SpO2 96 %     Weight      Height      Head Circumference      Peak Flow      Pain Score 9     Pain Loc      Pain Edu?      Excl. in GC?     Constitutional: Alert and oriented. No acute distress. Pleasant and interactive  Nose: No congestion/rhinnorhea. Mouth/Throat: Mucous membranes are moist.  No sore throat  Neck:  Painless ROM Cardiovascular: Normal rate, regular rhythm. Grossly normal heart sounds.  Good peripheral circulation. Respiratory: Normal respiratory effort.  No retractions. Lungs CTAB. Gastrointestinal: Soft and nontender. No distention.    Musculoskeletal: No lower extremity tenderness nor edema.  Warm and well perfused Neurologic:  Normal speech and language. No gross focal neurologic deficits are appreciated.  Skin:  Skin is warm, dry and intact. No rash noted. Psychiatric: Mood and affect are normal. Speech and behavior are normal.  ____________________________________________   LABS (all labs ordered are listed, but only abnormal results are displayed)  Labs Reviewed  COMPREHENSIVE METABOLIC PANEL - Abnormal; Notable  for the following components:      Result Value   Glucose, Bld 136 (*)    Total Bilirubin 1.5 (*)    All other components within normal limits  CBC  LIPASE, BLOOD  BRAIN NATRIURETIC PEPTIDE  TROPONIN I (HIGH SENSITIVITY)   ____________________________________________  EKG  ED ECG REPORT I, Jene Every, the attending physician, personally viewed and interpreted this ECG.  Date: 02/11/2021  Rhythm: normal sinus rhythm QRS Axis: normal Intervals: Occasional PVC ST/T Wave abnormalities: normal Narrative Interpretation: no evidence of acute ischemia  ____________________________________________  RADIOLOGY  Chest x-ray reviewed by me, no acute abnormality ____________________________________________   PROCEDURES  Procedure(s) performed: No  Procedures   Critical Care performed: No ____________________________________________   INITIAL IMPRESSION / ASSESSMENT AND PLAN / ED COURSE  Pertinent labs & imaging results that were available during my care of the patient were reviewed by me and considered in my medical decision making (see chart for details).   Patient well-appearing and in no acute distress, asymptomatic at this time, reports symptoms only occur when lying down at night.  Highly suspicious for GERD, not consistent with ACS given reassuring EKG, normal troponin, lab work otherwise unremarkable.  X-ray without acute abnormality  We will start the patient on Protonix, outpatient follow-up as discussed, return precautions discussed, patient agrees with this plan.    ____________________________________________   FINAL CLINICAL IMPRESSION(S) / ED DIAGNOSES  Final diagnoses:  Atypical chest pain  Gastroesophageal reflux disease, unspecified whether esophagitis present        Note:  This document was prepared using Dragon voice recognition software and may include unintentional dictation errors.    Jene Every, MD 02/11/21 [phone]

## 2021-02-11 NOTE — ED Provider Notes (Signed)
Emergency Medicine Provider Triage Evaluation Note  Jesse Adams , a 50 y.o. male  was evaluated in triage.  Pt complains of chest discomfort w/ some associated SOB.  Worse at night.  Review of Systems  Positive: Chest tightness w/ some pain, mild SOB w/ orthopnea Negative: Fever, sore throat, N/V, abd pain  Physical Exam  BP (!) 148/71 (BP Location: Left Arm)   Pulse 76   Temp 99 F (37.2 C) (Oral)   Resp 20   SpO2 96%  Gen:   Awake, no distress   Resp:  Normal effort, no retractions MSK:   Moves extremities without difficulty  Other:  No abdominal distention.  Medical Decision Making  Medically screening exam initiated at 6:10 AM.  Appropriate orders placed.  Jesse Adams was informed that the remainder of the evaluation will be completed by another provider, this initial triage assessment does not replace that evaluation, and the importance of remaining in the ED until their evaluation is complete.  Patient is stable to wait for an exam room.  Non-ischemic EKG.  CXR and labs are pending.   Loleta Rose, MD 02/11/21 [phone]

## 2021-04-02 IMAGING — CR DG CHEST 2V
3 series · 3 of 3 positions shown · non-contrast
Comparison: Radiograph 10/05/2016

CLINICAL DATA: Shortness of breath. COVID positive 03/21/2020
worsening cough.

EXAM:
CHEST - 2 VIEW

[chest pa]
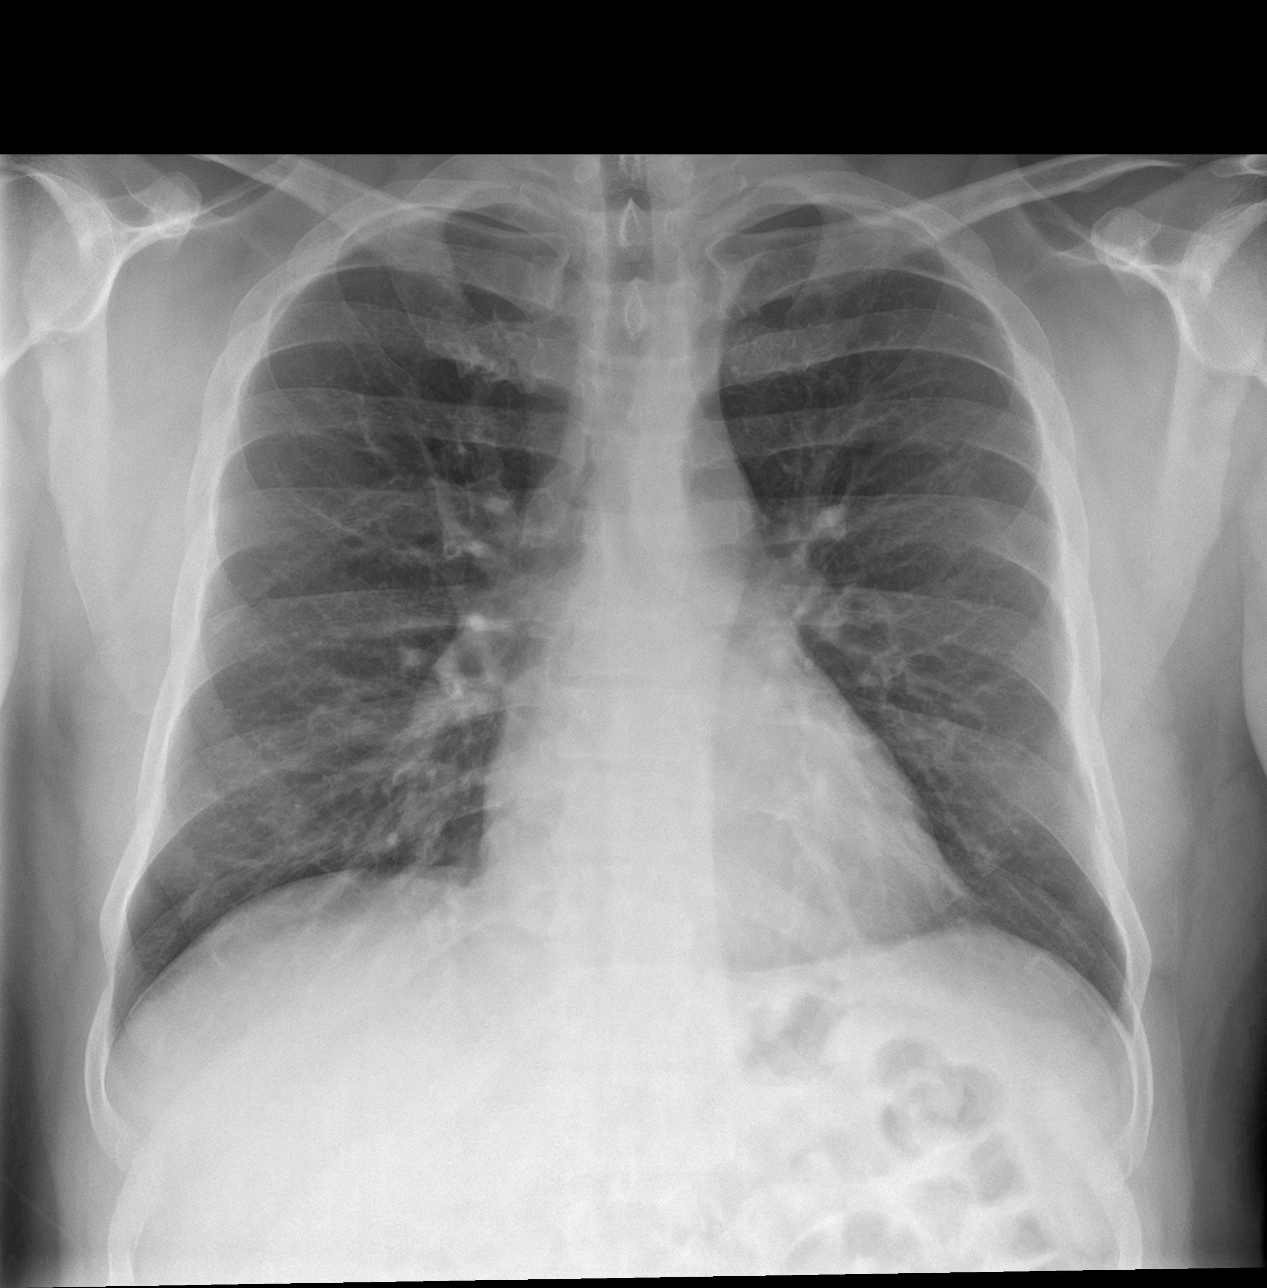

[chest lat (1 of 2)]
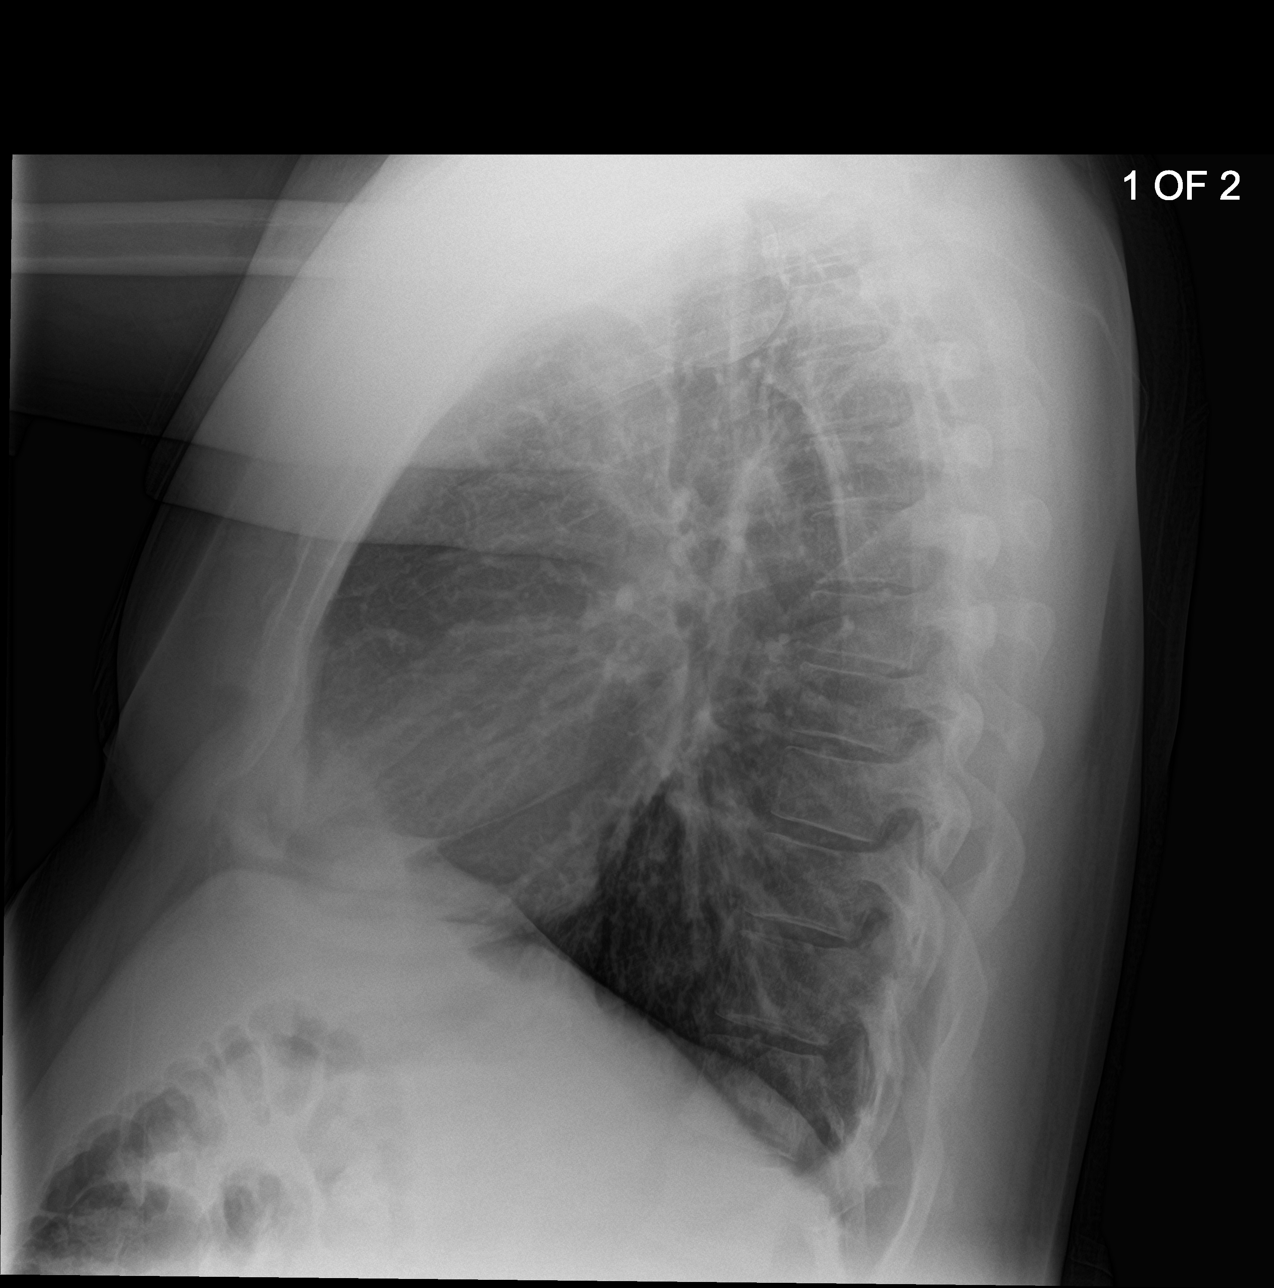

[chest lat (2 of 2)]
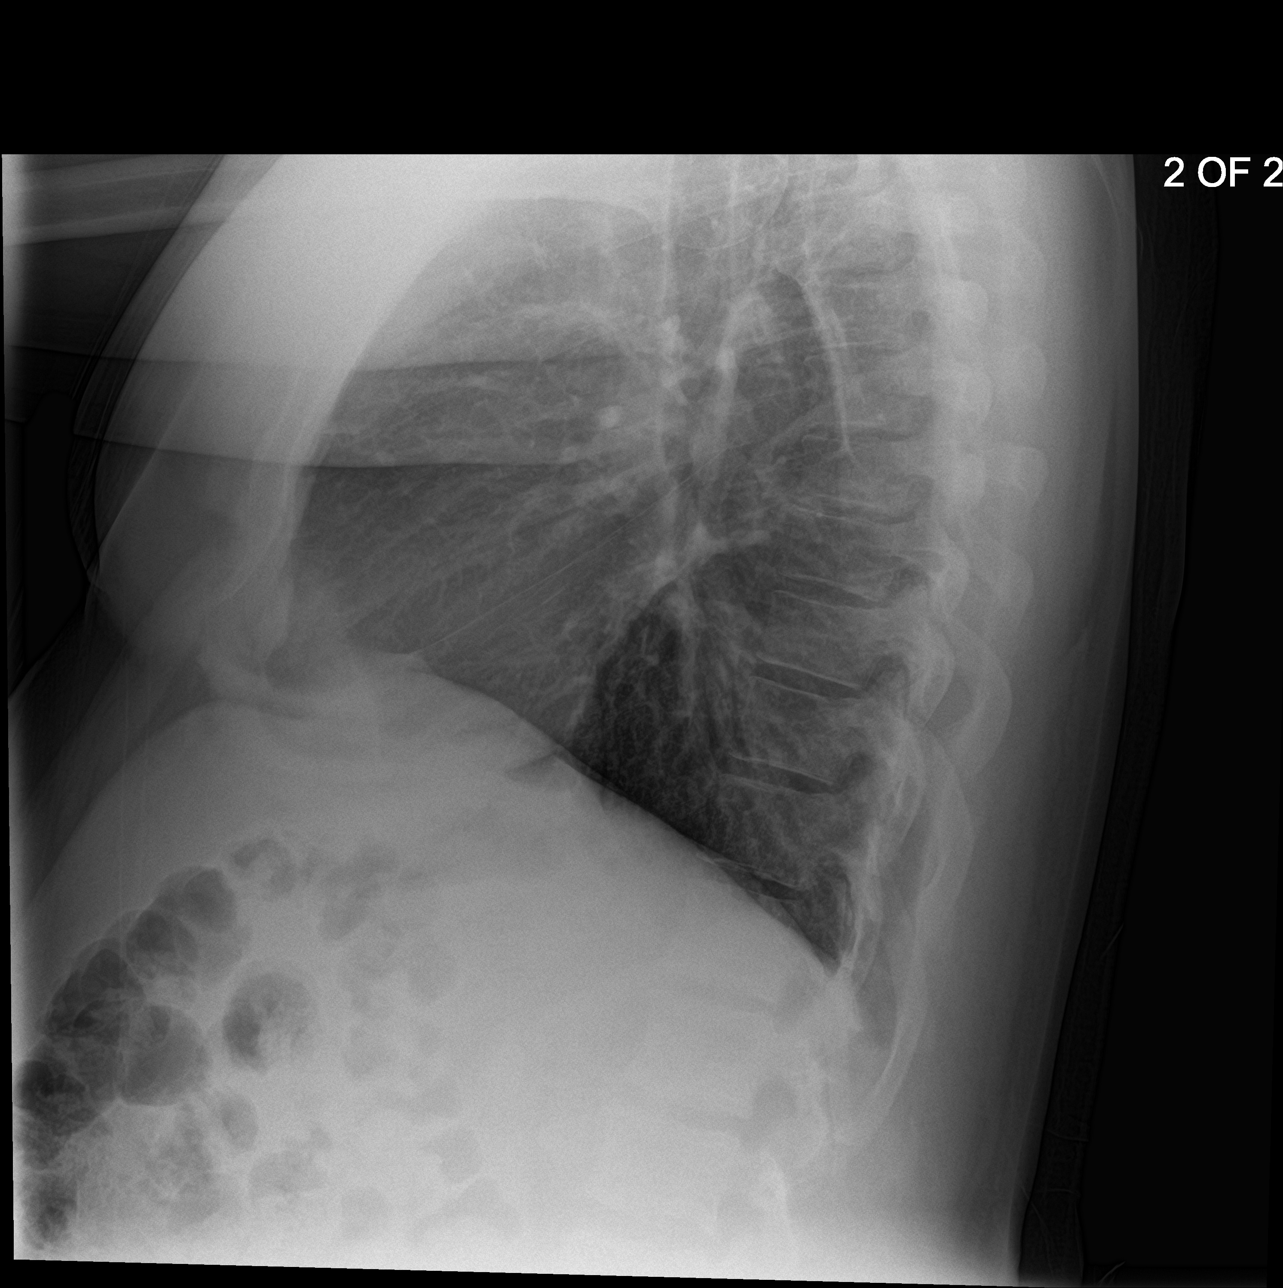

[3 of 3 positions shown; findings below may reference images not displayed]

FINDINGS: Normal heart size and mediastinal contours. There is peribronchial
thickening with borderline hyperinflation. Ill-defined patchy
opacity in the right greater than left mid lung. No pleural fluid or
pneumothorax. No evidence of pneumomediastinum. No acute osseous
abnormalities are seen.
IMPRESSION: 1. Ill-defined vague bilateral lung opacities likely sequela of
COVID pneumonia.
2. Bronchial thickening with borderline hyperinflation can be seen
in the setting of asthma or bronchitis.

## 2022-04-29 ENCOUNTER — Other Ambulatory Visit: Payer: Self-pay

## 2022-04-29 ENCOUNTER — Emergency Department: Payer: No Typology Code available for payment source

## 2022-04-29 ENCOUNTER — Emergency Department
Admission: EM | Admit: 2022-04-29 | Discharge: 2022-04-29 | Disposition: A | Discharge status: Home / Self Care | Payer: No Typology Code available for payment source | Attending: Student in an Organized Health Care Education/Training Program | Admitting: Student in an Organized Health Care Education/Training Program

## 2022-04-29 DIAGNOSIS — Z1152 Encounter for screening for COVID-19: Secondary | ICD-10-CM | POA: Insufficient documentation

## 2022-04-29 DIAGNOSIS — J45909 Unspecified asthma, uncomplicated: Secondary | ICD-10-CM | POA: Insufficient documentation

## 2022-04-29 DIAGNOSIS — R059 Cough, unspecified: Secondary | ICD-10-CM | POA: Diagnosis present

## 2022-04-29 DIAGNOSIS — Z8616 Personal history of COVID-19: Secondary | ICD-10-CM | POA: Diagnosis not present

## 2022-04-29 DIAGNOSIS — R051 Acute cough: Secondary | ICD-10-CM | POA: Insufficient documentation

## 2022-04-29 LAB — CBC WITH DIFFERENTIAL/PLATELET
Abs Immature Granulocytes: 0.01 10*3/uL (ref 0.00–0.07)
Basophils Absolute: 0 10*3/uL (ref 0.0–0.1)
Basophils Relative: 0 %
Eosinophils Absolute: 0.1 10*3/uL (ref 0.0–0.5)
Eosinophils Relative: 3 %
HCT: 43.1 % (ref 39.0–52.0)
Hemoglobin: 13.9 g/dL (ref 13.0–17.0)
Immature Granulocytes: 0 %
Lymphocytes Relative: 54 %
Lymphs Abs: 2.9 10*3/uL (ref 0.7–4.0)
MCH: 27.1 pg (ref 26.0–34.0)
MCHC: 32.3 g/dL (ref 30.0–36.0)
MCV: 84 fL (ref 80.0–100.0)
Monocytes Absolute: 0.5 10*3/uL (ref 0.1–1.0)
Monocytes Relative: 9 %
Neutro Abs: 1.8 10*3/uL (ref 1.7–7.7)
Neutrophils Relative %: 34 %
Platelets: 201 10*3/uL (ref 150–400)
RBC: 5.13 MIL/uL (ref 4.22–5.81)
RDW: 12.8 % (ref 11.5–15.5)
WBC: 5.3 10*3/uL (ref 4.0–10.5)
nRBC: 0 % (ref 0.0–0.2)

## 2022-04-29 LAB — BASIC METABOLIC PANEL
Anion gap: 7 (ref 5–15)
BUN: 12 mg/dL (ref 6–20)
CO2: 26 mmol/L (ref 22–32)
Calcium: 8.6 mg/dL — ABNORMAL LOW (ref 8.9–10.3)
Chloride: 106 mmol/L (ref 98–111)
Creatinine, Ser: 0.92 mg/dL (ref 0.61–1.24)
GFR, Estimated: >60 mL/min (ref >60)
Glucose, Bld: 139 mg/dL — ABNORMAL HIGH (ref 70–99)
Potassium: 3.6 mmol/L (ref 3.5–5.1)
Sodium: 139 mmol/L (ref 135–145)

## 2022-04-29 LAB — RESP PANEL BY RT-PCR (RSV, FLU A&B, COVID)  RVPGX2
Influenza A by PCR: NEGATIVE
Influenza B by PCR: NEGATIVE
Resp Syncytial Virus by PCR: NEGATIVE
SARS Coronavirus 2 by RT PCR: NEGATIVE

## 2022-04-29 LAB — TROPONIN I (HIGH SENSITIVITY): Troponin I (High Sensitivity): 6 ng/L (ref <18)

## 2022-04-29 MED ORDER — ALBUTEROL SULFATE HFA 108 (90 BASE) MCG/ACT IN AERS: 2.0000 | INHALATION_SPRAY | Freq: Four times a day (QID) | RESPIRATORY_TRACT | 2 refills | Status: DC | PRN

## 2022-04-29 MED ORDER — IPRATROPIUM-ALBUTEROL 0.5-2.5 (3) MG/3ML IN SOLN
3.0000 mL | Freq: Once | RESPIRATORY_TRACT | Status: AC
Start: 2022-04-29 — End: 2022-04-29
  Administered 2022-04-29: 3 mL via RESPIRATORY_TRACT
  Filled 2022-04-29: qty 3

## 2022-04-29 MED ORDER — PREDNISONE 20 MG PO TABS: 40.0000 mg | ORAL_TABLET | Freq: Every day | ORAL | 0 refills | Status: AC

## 2022-04-29 NOTE — ED Triage Notes (Signed)
Pt presents to ED with /co of "not feeling well". Pt states he has a cough that started a few days ago. Pt denies fevers or chills.

## 2022-04-29 NOTE — ED Provider Notes (Signed)
[facility] Provider Note    Event Date/Time   First MD Initiated Contact with Patient 04/29/22 1632     (approximate)   History   Cough   HPI  Jesse Adams is a 52 y.o. male remote history of asthma presents to the ER for evaluation of "not feeling well" including cough that started few days ago.  No fevers or chills nonproductive.  Feels like he can occasionally feel some wheezing particularly when the weather changes.  Has not taken any inhalers.  Denies any chest pain.  No shortness of breath no nausea or vomiting.     Physical Exam   Triage Vital Signs: ED Triage Vitals [04/29/22 1559]  Enc Vitals Group     BP 117/73     Pulse Rate 84     Resp 17     Temp 98.7 F (37.1 C)     Temp Source Oral     SpO2 96 %     Weight      Height      Head Circumference      Peak Flow      Pain Score 0     Pain Loc      Pain Edu?      Excl. in [place]?     Most recent vital signs: Vitals:   04/29/22 1559  BP: 117/73  Pulse: 84  Resp: 17  Temp: 98.7 F (37.1 C)  SpO2: 96%     Constitutional: Alert  Eyes: Conjunctivae are normal.  Head: Atraumatic. Nose: No congestion/rhinnorhea. Mouth/Throat: Mucous membranes are moist.   Neck: Painless ROM.  Cardiovascular:   Good peripheral circulation. Respiratory: Normal respiratory effort.  No retractions.  Gastrointestinal: Soft and nontender.  Musculoskeletal:  no deformity Neurologic:  MAE spontaneously. No gross focal neurologic deficits are appreciated.  Skin:  Skin is warm, dry and intact. No rash noted. Psychiatric: Mood and affect are normal. Speech and behavior are normal.    ED Results / Procedures / Treatments   Labs (all labs ordered are listed, but only abnormal results are displayed) Labs Reviewed  BASIC METABOLIC PANEL - Abnormal; Notable for the following components:      Result Value   Glucose, Bld 139 (*)    Calcium 8.6 (*)    All other components within normal  limits  RESP PANEL BY RT-PCR (RSV, FLU A&B, COVID)  RVPGX2  CBC WITH DIFFERENTIAL/PLATELET  TROPONIN I (HIGH SENSITIVITY)     EKG     RADIOLOGY Please see ED Course for my review and interpretation.  I personally reviewed all radiographic images ordered to evaluate for the above acute complaints and reviewed radiology reports and findings.  These findings were personally discussed with the patient.  Please see medical record for radiology report.    PROCEDURES:  Critical Care performed:   Procedures   MEDICATIONS ORDERED IN ED: Medications  ipratropium-albuterol (DUONEB) 0.5-2.5 (3) MG/3ML nebulizer solution 3 mL (3 mLs Nebulization Given 04/29/22 1735)     IMPRESSION / MDM / ASSESSMENT AND PLAN / ED COURSE  I reviewed the triage vital signs and the nursing notes.                              Differential diagnosis includes, but is not limited to, bronchitis, asthma, pneumonia, viral illness, anemia  Patient presenting to the ER for evaluation of symptoms as described above.  Based on symptoms, risk  factors and considered above differential, this presenting complaint could reflect a potentially life-threatening illness therefore the patient will be placed on continuous pulse oximetry and telemetry for monitoring.  Laboratory evaluation will be sent to evaluate for the above complaints.  Patient well-appearing.  Not hypoxic.  No tachycardia.  Possible bronchitis or asthma.  Not consistent with PE.    Clinical Course as of 04/29/22 1844  Tue Apr 29, 2022  1650 Chest x-ray my review and interpretation without evidence of pneumothorax or consolidation. [PR]  2202 Patient's blood work is reassuring.  Did have some improvement after nebulizer treatment will treat for bronchitis.  Blood work is reassuring.  Patient does appear stable and appropriate for outpatient follow-up. [PR]    Clinical Course User Index [PR] Merlyn Lot, MD     FINAL CLINICAL IMPRESSION(S) / ED  DIAGNOSES   Final diagnoses:  Acute cough     Rx / DC Orders   ED Discharge Orders          Ordered    albuterol (VENTOLIN HFA) 108 (90 Base) MCG/ACT inhaler  Every 6 hours PRN        04/29/22 1839    predniSONE (DELTASONE) 20 MG tablet  Daily        04/29/22 1839             Note:  This document was prepared using Dragon voice recognition software and may include unintentional dictation errors.    Merlyn Lot, MD 04/29/22 [phone]

## 2023-01-23 ENCOUNTER — Emergency Department: Payer: No Typology Code available for payment source

## 2023-01-23 ENCOUNTER — Other Ambulatory Visit: Payer: Self-pay

## 2023-01-23 ENCOUNTER — Emergency Department
Admission: EM | Admit: 2023-01-23 | Discharge: 2023-01-23 | Disposition: A | Discharge status: Home / Self Care | Payer: No Typology Code available for payment source | Attending: Emergency Medicine | Admitting: Emergency Medicine

## 2023-01-23 DIAGNOSIS — J40 Bronchitis, not specified as acute or chronic: Secondary | ICD-10-CM | POA: Insufficient documentation

## 2023-01-23 DIAGNOSIS — R059 Cough, unspecified: Secondary | ICD-10-CM | POA: Diagnosis present

## 2023-01-23 DIAGNOSIS — Z20822 Contact with and (suspected) exposure to covid-19: Secondary | ICD-10-CM | POA: Diagnosis not present

## 2023-01-23 LAB — URINALYSIS, ROUTINE W REFLEX MICROSCOPIC
Bilirubin Urine: NEGATIVE
Glucose, UA: NEGATIVE mg/dL
Hgb urine dipstick: NEGATIVE
Ketones, ur: NEGATIVE mg/dL
Leukocytes,Ua: NEGATIVE
Nitrite: NEGATIVE
Protein, ur: NEGATIVE mg/dL
Specific Gravity, Urine: 1.017 (ref 1.005–1.030)
pH: 7 (ref 5.0–8.0)

## 2023-01-23 LAB — CBC
HCT: 38.1 % — ABNORMAL LOW (ref 39.0–52.0)
Hemoglobin: 12.3 g/dL — ABNORMAL LOW (ref 13.0–17.0)
MCH: 27.2 pg (ref 26.0–34.0)
MCHC: 32.3 g/dL (ref 30.0–36.0)
MCV: 84.3 fL (ref 80.0–100.0)
Platelets: 235 10*3/uL (ref 150–400)
RBC: 4.52 MIL/uL (ref 4.22–5.81)
RDW: 13 % (ref 11.5–15.5)
WBC: 3.6 10*3/uL — ABNORMAL LOW (ref 4.0–10.5)
nRBC: 0 % (ref 0.0–0.2)

## 2023-01-23 LAB — COMPREHENSIVE METABOLIC PANEL
ALT: 27 U/L (ref 0–44)
AST: 26 U/L (ref 15–41)
Albumin: 3.5 g/dL (ref 3.5–5.0)
Alkaline Phosphatase: 53 U/L (ref 38–126)
Anion gap: 8 (ref 5–15)
BUN: 13 mg/dL (ref 6–20)
CO2: 27 mmol/L (ref 22–32)
Calcium: 8.7 mg/dL — ABNORMAL LOW (ref 8.9–10.3)
Chloride: 105 mmol/L (ref 98–111)
Creatinine, Ser: 1.03 mg/dL (ref 0.61–1.24)
GFR, Estimated: >60 mL/min (ref >60)
Glucose, Bld: 110 mg/dL — ABNORMAL HIGH (ref 70–99)
Potassium: 3.9 mmol/L (ref 3.5–5.1)
Sodium: 140 mmol/L (ref 135–145)
Total Bilirubin: 1.3 mg/dL — ABNORMAL HIGH (ref 0.3–1.2)
Total Protein: 6.9 g/dL (ref 6.5–8.1)

## 2023-01-23 LAB — SARS CORONAVIRUS 2 BY RT PCR: SARS Coronavirus 2 by RT PCR: NEGATIVE

## 2023-01-23 LAB — LIPASE, BLOOD: Lipase: 27 U/L (ref 11–51)

## 2023-01-23 MED ORDER — GUAIFENESIN ER 600 MG PO TB12: 600.0000 mg | ORAL_TABLET | Freq: Two times a day (BID) | ORAL | 0 refills | Status: AC

## 2023-01-23 MED ORDER — LEVALBUTEROL TARTRATE 45 MCG/ACT IN AERO
1.0000 | INHALATION_SPRAY | Freq: Four times a day (QID) | RESPIRATORY_TRACT | 0 refills | Status: AC | PRN
Start: 2023-01-23 — End: ?

## 2023-01-23 NOTE — ED Provider Notes (Signed)
[facility] Provider Note    Event Date/Time   First MD Initiated Contact with Patient 01/23/23 1136     (approximate)   History   Chief Complaint Diarrhea, Nasal Congestion, and Cough   HPI  Kalib L Vonbargen is a 52 y.o. male with no significant past medical history who presents to the ED complaining of 6 days of sinus pressure, generalized weakness, malaise, productive cough, fevers, and diarrhea.  He denies any pain in his abdomen and has not had any nausea or vomiting.  He also denies any chest pain or shortness of breath.  He is not aware of any sick contacts, states he often deals with similar symptoms with the "changes in weather."      Physical Exam   Triage Vital Signs: ED Triage Vitals  Encounter Vitals Group     BP 01/23/23 1114 111/65     Systolic BP Percentile --      Diastolic BP Percentile --      Pulse Rate 01/23/23 1114 69     Resp 01/23/23 1114 18     Temp 01/23/23 1114 98.8 F (37.1 C)     Temp src --      SpO2 01/23/23 1114 97 %     Weight 01/23/23 1113 250 lb (113.4 kg)     Height 01/23/23 1113 6' (1.829 m)     Head Circumference --      Peak Flow --      Pain Score 01/23/23 1112 7     Pain Loc --      Pain Education --      Exclude from Growth Chart --     Most recent vital signs: Vitals:   01/23/23 1114 01/23/23 1319  BP: 111/65 114/70  Pulse: 69 70  Resp: 18 16  Temp: 98.8 F (37.1 C)   SpO2: 97% 98%    Constitutional: Alert and oriented. Eyes: Conjunctivae are normal. Head: Atraumatic. Nose: No congestion/rhinnorhea. Mouth/Throat: Mucous membranes are moist.  Cardiovascular: Normal rate, regular rhythm. Grossly normal heart sounds.  2+ radial pulses bilaterally. Respiratory: Normal respiratory effort.  No retractions. Lungs CTAB. Gastrointestinal: Soft and nontender. No distention. Musculoskeletal: No lower extremity tenderness nor edema.  Neurologic:  Normal speech and language. No gross focal  neurologic deficits are appreciated.    ED Results / Procedures / Treatments   Labs (all labs ordered are listed, but only abnormal results are displayed) Labs Reviewed  COMPREHENSIVE METABOLIC PANEL - Abnormal; Notable for the following components:      Result Value   Glucose, Bld 110 (*)    Calcium 8.7 (*)    Total Bilirubin 1.3 (*)    All other components within normal limits  CBC - Abnormal; Notable for the following components:   WBC 3.6 (*)    Hemoglobin 12.3 (*)    HCT 38.1 (*)    All other components within normal limits  URINALYSIS, ROUTINE W REFLEX MICROSCOPIC - Abnormal; Notable for the following components:   Color, Urine YELLOW (*)    APPearance CLEAR (*)    All other components within normal limits  SARS CORONAVIRUS 2 BY RT PCR  LIPASE, BLOOD   RADIOLOGY Chest x-ray reviewed and interpreted by me with no infiltrate, edema, or effusion.  PROCEDURES:  Critical Care performed: No  Procedures   MEDICATIONS ORDERED IN ED: Medications - No data to display   IMPRESSION / MDM / ASSESSMENT AND PLAN / ED COURSE  I reviewed the  triage vital signs and the nursing notes.                              52 y.o. male with no significant past medical history presents to the ED complaining of 6 days of sinus pressure, malaise, weakness, diarrhea, and productive cough.  Patient's presentation is most consistent with acute presentation with potential threat to life or bodily function.  Differential diagnosis includes, but is not limited to, viral syndrome, pneumonia, anemia, electrolyte abnormality, AKI, gastroenteritis.  Patient well-appearing and in no acute distress, vital signs are unremarkable.  Labs show no significant anemia, leukocytosis, tract abnormality, or AKI.  LFTs and lipase are also unremarkable.  COVID testing is negative, urinalysis with no signs of infection, chest x-ray pending at this time.  Chest x-ray unremarkable by my read, patient continues to  breathe comfortably on room air.  He is appropriate for discharge home with PCP follow-up, will be prescribed albuterol and Mucinex for symptomatic management.  He was counseled to return to the ED for new or worsening symptoms, patient agrees with plan.      FINAL CLINICAL IMPRESSION(S) / ED DIAGNOSES   Final diagnoses:  Bronchitis     Rx / DC Orders   ED Discharge Orders          Ordered    levalbuterol (XOPENEX HFA) 45 MCG/ACT inhaler  Every 6 hours PRN        01/23/23 1412    guaiFENesin (MUCINEX) 600 MG 12 hr tablet  2 times daily        01/23/23 1412             Note:  This document was prepared using Dragon voice recognition software and may include unintentional dictation errors.   Chesley Noon, MD 01/23/23 [phone]

## 2023-01-23 NOTE — ED Triage Notes (Signed)
Pt states productive cough, diarrhea and sinus headache x 6 days.

## 2023-01-23 NOTE — ED Notes (Signed)
Patient ambulated to x-ray and back with a steady gait. No dyspnea noted.

## 2024-01-21 ENCOUNTER — Other Ambulatory Visit: Payer: Self-pay

## 2024-01-21 ENCOUNTER — Emergency Department
Admission: EM | Admit: 2024-01-21 | Discharge: 2024-01-21 | Disposition: A | Discharge status: Home / Self Care | Attending: Emergency Medicine | Admitting: Emergency Medicine

## 2024-01-21 ENCOUNTER — Encounter: Payer: Self-pay | Admitting: Emergency Medicine

## 2024-01-21 DIAGNOSIS — R509 Fever, unspecified: Secondary | ICD-10-CM | POA: Insufficient documentation

## 2024-01-21 DIAGNOSIS — R5383 Other fatigue: Secondary | ICD-10-CM | POA: Insufficient documentation

## 2024-01-21 DIAGNOSIS — J111 Influenza due to unidentified influenza virus with other respiratory manifestations: Secondary | ICD-10-CM

## 2024-01-21 DIAGNOSIS — R5381 Other malaise: Secondary | ICD-10-CM | POA: Insufficient documentation

## 2024-01-21 DIAGNOSIS — M791 Myalgia, unspecified site: Secondary | ICD-10-CM | POA: Insufficient documentation

## 2024-01-21 DIAGNOSIS — R112 Nausea with vomiting, unspecified: Secondary | ICD-10-CM | POA: Insufficient documentation

## 2024-01-21 LAB — RESP PANEL BY RT-PCR (RSV, FLU A&B, COVID)  RVPGX2
Influenza A by PCR: NEGATIVE
Influenza B by PCR: NEGATIVE
Resp Syncytial Virus by PCR: NEGATIVE
SARS Coronavirus 2 by RT PCR: NEGATIVE

## 2024-01-21 MED ORDER — FAMOTIDINE 20 MG PO TABS
40.0000 mg | ORAL_TABLET | Freq: Once | ORAL | Status: AC
  Administered 2024-01-21: 40 mg via ORAL
  Filled 2024-01-21: qty 2

## 2024-01-21 MED ORDER — ONDANSETRON 4 MG PO TBDP
4.0000 mg | ORAL_TABLET | Freq: Once | ORAL | Status: AC
  Administered 2024-01-21: 4 mg via ORAL
  Filled 2024-01-21: qty 1

## 2024-01-21 MED ORDER — ONDANSETRON 4 MG PO TBDP
4.0000 mg | ORAL_TABLET | Freq: Three times a day (TID) | ORAL | 0 refills | Status: AC | PRN
Start: 2024-01-21 — End: ?

## 2024-01-21 MED ORDER — NAPROXEN 500 MG PO TABS
500.0000 mg | ORAL_TABLET | Freq: Once | ORAL | Status: AC
Start: 2024-01-21 — End: 2024-01-21
  Administered 2024-01-21: 500 mg via ORAL
  Filled 2024-01-21: qty 1

## 2024-01-21 NOTE — ED Triage Notes (Signed)
 Patient to ED via POV for fever/body aches since Monday. 99.8 fever at home per pt. States coworkers have been sick.

## 2024-01-21 NOTE — ED Provider Notes (Signed)
 [facility] Provider Note    Event Date/Time   First MD Initiated Contact with Patient 01/21/24 1435     (approximate)   History   Fever   HPI  Jesse Adams is a 53 y.o. male who comes ED complaining of bodyaches, fever, fatigue, malaise, nausea for the past 3 days, initially had a few episodes of vomiting, but today and yesterday has been tolerating oral intake and drinking plenty of fluids.  No dizziness chest pain shortness of breath or cough.  Multiple coworkers have been sick recently.     Physical Exam   Triage Vital Signs: ED Triage Vitals [01/21/24 1421]  Encounter Vitals Group     BP 99/66     Girls Systolic BP Percentile      Girls Diastolic BP Percentile      Boys Systolic BP Percentile      Boys Diastolic BP Percentile      Pulse Rate 66     Resp 17     Temp 98.5 F (36.9 C)     Temp Source Oral     SpO2 98 %     Weight 235 lb (106.6 kg)     Height 6' (1.829 m)     Head Circumference      Peak Flow      Pain Score 7     Pain Loc      Pain Education      Exclude from Growth Chart     Most recent vital signs: Vitals:   01/21/24 1421  BP: 99/66  Pulse: 66  Resp: 17  Temp: 98.5 F (36.9 C)  SpO2: 98%    General: Awake, no distress.  CV:  Good peripheral perfusion.  Regular rate rhythm Resp:  Normal effort.  Clear lungs Abd:  No distention.  Soft nontender Other:  No lower extremity edema or calf tenderness.  Moist oral mucosa   ED Results / Procedures / Treatments   Labs (all labs ordered are listed, but only abnormal results are displayed) Labs Reviewed  RESP PANEL BY RT-PCR (RSV, FLU A&B, COVID)  RVPGX2     RADIOLOGY    PROCEDURES:  Procedures   MEDICATIONS ORDERED IN ED: Medications  naproxen (NAPROSYN) tablet 500 mg (has no administration in time range)  famotidine (PEPCID) tablet 40 mg (has no administration in time range)  ondansetron  (ZOFRAN -ODT) disintegrating tablet 4 mg (has no  administration in time range)     IMPRESSION / MDM / ASSESSMENT AND PLAN / ED COURSE  I reviewed the triage vital signs and the nursing notes.                             Patient presents with influenza-like illness.  Vital signs and exam are reassuring, not significantly dehydrated.  Symptoms improving.  Will provide supportive care.  Doubt ACS PE dissection pancreatitis or biliary disease.       FINAL CLINICAL IMPRESSION(S) / ED DIAGNOSES   Final diagnoses:  Influenza-like illness     Rx / DC Orders   ED Discharge Orders          Ordered    ondansetron  (ZOFRAN -ODT) 4 MG disintegrating tablet  Every 8 hours PRN        01/21/24 1517             Note:  This document was prepared using Dragon voice recognition software and may include unintentional dictation  errors.   Viviann Pastor, MD 01/21/24 [phone]
